# Patient Record
Sex: Male | Born: 1950 | Race: Black or African American | Hispanic: No | Smoking: Never smoker
Health system: Southern US, Community
[De-identification: ages and names within clinical notes are randomized; demographics above are authoritative.]

## PROBLEM LIST (undated history)

## (undated) DIAGNOSIS — K219 Gastro-esophageal reflux disease without esophagitis: Secondary | ICD-10-CM

## (undated) DIAGNOSIS — F419 Anxiety disorder, unspecified: Secondary | ICD-10-CM

## (undated) DIAGNOSIS — N3281 Overactive bladder: Secondary | ICD-10-CM

## (undated) DIAGNOSIS — H269 Unspecified cataract: Secondary | ICD-10-CM

## (undated) DIAGNOSIS — B001 Herpesviral vesicular dermatitis: Secondary | ICD-10-CM

## (undated) HISTORY — DX: Gastro-esophageal reflux disease without esophagitis: K21.9

## (undated) HISTORY — DX: Herpesviral vesicular dermatitis: B00.1

## (undated) HISTORY — DX: Unspecified cataract: H26.9

## (undated) HISTORY — DX: Anxiety disorder, unspecified: F41.9

## (undated) HISTORY — PX: REFRACTIVE SURGERY: SHX103

## (undated) HISTORY — DX: Overactive bladder: N32.81

---

## 2011-10-25 ENCOUNTER — Ambulatory Visit (INDEPENDENT_AMBULATORY_CARE_PROVIDER_SITE_OTHER): Payer: Federal, State, Local not specified - PPO

## 2011-10-25 DIAGNOSIS — B9789 Other viral agents as the cause of diseases classified elsewhere: Secondary | ICD-10-CM

## 2012-07-15 ENCOUNTER — Ambulatory Visit (INDEPENDENT_AMBULATORY_CARE_PROVIDER_SITE_OTHER): Payer: Federal, State, Local not specified - PPO | Admitting: Family Medicine

## 2012-07-15 VITALS — BP 119/78 | HR 68 | Temp 97.8°F | Resp 16 | Ht 70.25 in | Wt 211.6 lb

## 2012-07-15 DIAGNOSIS — H00019 Hordeolum externum unspecified eye, unspecified eyelid: Secondary | ICD-10-CM

## 2012-07-15 MED ORDER — DOXYCYCLINE HYCLATE 100 MG PO TABS: 100.0000 mg | ORAL_TABLET | Freq: Two times a day (BID) | ORAL | Status: AC

## 2012-07-15 MED ORDER — TOBRAMYCIN 0.3 % OP SOLN: 1.0000 [drp] | OPHTHALMIC | Status: AC

## 2012-07-15 NOTE — Progress Notes (Signed)
  Subjective:    Patient ID: Nicholas Huang, male    DOB: [birth year], 61 y.o.   MRN: [id number]  HPI Painful swelling left lower eye lid x several days.  He works for [employer] in [employer] center where it is dusty.  Symptoms began 4 days ago and he's tried an OTC stye ointment though symptoms are worsening   Review of Systems     Objective:   Physical Exam Well developed well nourished male in obvious pain due to swelling of left lower eye lid. EOM nl, PERLA Palpable 3 mm nodule medial lower left lid       Assessment & Plan:  Stye left eye treat with Doxycycline and Tobrex drops, instructed to use warm compresses

## 2012-08-01 ENCOUNTER — Other Ambulatory Visit: Payer: Self-pay | Admitting: Family Medicine

## 2012-08-29 ENCOUNTER — Ambulatory Visit (INDEPENDENT_AMBULATORY_CARE_PROVIDER_SITE_OTHER): Payer: Federal, State, Local not specified - PPO | Admitting: Family Medicine

## 2012-08-29 VITALS — BP 127/74 | HR 57 | Temp 98.2°F | Resp 16 | Ht 72.0 in | Wt 220.0 lb

## 2012-08-29 DIAGNOSIS — H00015 Hordeolum externum left lower eyelid: Secondary | ICD-10-CM

## 2012-08-29 DIAGNOSIS — H00019 Hordeolum externum unspecified eye, unspecified eyelid: Secondary | ICD-10-CM

## 2012-08-29 MED ORDER — DOXYCYCLINE HYCLATE 100 MG PO TABS: 100.0000 mg | ORAL_TABLET | Freq: Two times a day (BID) | ORAL | Status: DC

## 2012-08-29 NOTE — Patient Instructions (Addendum)
Sty A sty (hordeolum) is an infection of a gland in the eyelid located at the base of the eyelash. A sty may develop a white or yellow head of pus. It can be puffy (swollen). Usually, the sty will burst and pus will come out on its own. They do not leave lumps in the eyelid once they drain. A sty is often confused with another form of cyst of the eyelid called a chalazion. Chalazions occur within the eyelid and not on the edge where the bases of the eyelashes are. They often are red, sore and then form firm lumps in the eyelid. CAUSES   Germs (bacteria).  Lasting (chronic) eyelid inflammation. SYMPTOMS   Tenderness, redness and swelling along the edge of the eyelid at the base of the eyelashes.  Sometimes, there is a white or yellow head of pus. It may or may not drain. DIAGNOSIS  An ophthalmologist will be able to distinguish between a sty and a chalazion and treat the condition appropriately.  TREATMENT   Styes are typically treated with warm packs (compresses) until drainage occurs.  In rare cases, medicines that kill germs (antibiotics) may be prescribed. These antibiotics may be in the form of drops, cream or pills.  If a hard lump has formed, it is generally necessary to do a small incision and remove the hardened contents of the cyst in a minor surgical procedure done in the office.  In suspicious cases, your caregiver may send the contents of the cyst to the lab to be certain that it is not a rare, but dangerous form of cancer of the glands of the eyelid. HOME CARE INSTRUCTIONS   Wash your hands often and dry them with a clean towel. Avoid touching your eyelid. This may spread the infection to other parts of the eye.  Apply heat to your eyelid for 10 to 20 minutes, several times a day, to ease pain and help to heal it faster.  Do not squeeze the sty. Allow it to drain on its own. Wash your eyelid carefully 3 to 4 times per day to remove any pus. SEEK IMMEDIATE MEDICAL CARE IF:     Your eye becomes painful or puffy (swollen).  Your vision changes.  Your sty does not drain by itself within 3 days.  Your sty comes back within a short period of time, even with treatment.  You have redness (inflammation) around the eye.  You have a fever. Document Released: 08/11/2005 Document Revised: 01/24/2012 Document Reviewed: 04/15/2009 [facility] Patient Information 2013 [place], [place].    61 yo man with left lower lid stye which was evaluated 8/31 and patient subsequently underwent I&D.  The stye seemed to improve after antibiotics and the procedure, but has come back.  The area is now hardened and nonpainful  Objective:  Hordeolum 3-4 mm, several loculations, medial lower left lid partially overlying the tear duct  Assessment:  Persistent hordeolum  Plan:  ophth referral and doxycycline 1. Hordeolum externum of left lower eyelid  Ambulatory referral to Ophthalmology, doxycycline (VIBRA-TABS) 100 MG tablet

## 2012-08-29 NOTE — Progress Notes (Signed)
61 yo man with left lower lid stye which was evaluated 8/31 and patient subsequently underwent I&D.  The stye seemed to improve after antibiotics and the procedure, but has come back.  The area is now hardened and nonpainful  Objective:  Hordeolum 3-4 mm, several loculations, medial lower left lid partially overlying the tear duct  Assessment:  Persistent hordeolum  Plan:  ophth referral and doxycycline 1. Hordeolum externum of left lower eyelid  Ambulatory referral to Ophthalmology, doxycycline (VIBRA-TABS) 100 MG tablet

## 2012-12-21 ENCOUNTER — Encounter: Payer: Self-pay | Admitting: *Deleted

## 2013-03-07 ENCOUNTER — Ambulatory Visit (INDEPENDENT_AMBULATORY_CARE_PROVIDER_SITE_OTHER): Payer: Federal, State, Local not specified - PPO | Admitting: Family Medicine

## 2013-03-07 VITALS — BP 142/82 | HR 62 | Temp 98.3°F | Resp 17 | Ht 71.0 in | Wt 220.0 lb

## 2013-03-07 DIAGNOSIS — B001 Herpesviral vesicular dermatitis: Secondary | ICD-10-CM

## 2013-03-07 DIAGNOSIS — L253 Unspecified contact dermatitis due to other chemical products: Secondary | ICD-10-CM

## 2013-03-07 DIAGNOSIS — B009 Herpesviral infection, unspecified: Secondary | ICD-10-CM

## 2013-03-07 MED ORDER — TRIAMCINOLONE ACETONIDE 0.1 % EX CREA: TOPICAL_CREAM | Freq: Two times a day (BID) | CUTANEOUS | Status: DC

## 2013-03-07 MED ORDER — VALACYCLOVIR HCL 1 G PO TABS: 1000.0000 mg | ORAL_TABLET | Freq: Three times a day (TID) | ORAL | Status: DC

## 2013-03-07 NOTE — Progress Notes (Signed)
[address]   [place], [place]  [address]   [phone]  Subjective:    Patient ID: Nicholas Huang, male    DOB: 1951-10-14, 62 y.o.   MRN: [id number]  HPI This 62 y.o. male presents for evaluation of blisters on L lower lip, L chin.  Onset two days ago.  Feels well; no fever/chills/sweats. +tingling; +itching.  History of cold sores.  Central lip location which is unusual.  No myalgias.  History of herpes labialis but usually does not spread to face.  No pain along rash.  Missed work today for office visit; works at [employer] and does not want to work with facial rash.  S/p shingles vaccine.  2.  Itching hands B and feet B:  Works at [employer]; washes hands frequently; hands itch; no rash or scaling.  Applying good moisturizer twice daily with persistent itching.   Review of Systems  Constitutional: Negative for fever, chills, diaphoresis and fatigue.  Musculoskeletal: Negative for myalgias.  Skin: Positive for color change and rash.       +itching.  Neurological: Negative for dizziness, light-headedness and headaches.  Hematological: Negative for adenopathy.   Past Medical History  Diagnosis Date  . Cataract   . GERD (gastroesophageal reflux disease)   . Anxiety   . Herpes labialis    No current outpatient prescriptions on file prior to visit.   No current facility-administered medications on file prior to visit.        History   Social History  . Marital Status: Divorced    Spouse Name: N/A    Number of Children: N/A  . Years of Education: N/A   Occupational History  .  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   Social History Main Topics  . Smoking status: Never Smoker   . Smokeless tobacco: Not on file  . Alcohol Use: No  . Drug Use: No  . Sexually Active: Yes   Other Topics Concern  . Not on file   Social History Narrative  . No narrative on file    Objective:   Physical Exam  Nursing note and vitals reviewed. Constitutional: He appears  well-developed and well-nourished. No distress.  HENT:  Head: Normocephalic and atraumatic.  Eyes: Conjunctivae are normal. Pupils are equal, round, and reactive to light.  Neck: Normal range of motion. Neck supple.  Lymphadenopathy:    He has no cervical adenopathy.  Skin: Rash noted. He is not diaphoretic.  Lower lip with single cluster of vesicles L.  Two vesicular lesions L lower chin.  No other lesions facial. B HANDS: DRY; NO SCALING; MILD MACULAR LESIONS SCATTERED B PALMS.       Assessment & Plan:  Herpes simplex labialis  Dermatitis, contact, from chemicals - Plan: triamcinolone cream (KENALOG) 0.1 %   1. Herpes Labialis:  New. Versus early HSV Zoster.  Reviewed in detail with patient.  Rx for Valtrex 1000mg  one po tid x 7 days. To call if develops more lesions or pain; will add Prednisone taper. OOW x 2 days. 2.  Contact Dermatitis B hands:  New.  Versus dishydrotic eczema.  Rx for Triamcinolone cream 0.1% bid.  Decrease hand washing as much as possible.  Meds ordered this encounter  Medications  . valACYclovir (VALTREX) 1000 MG tablet    Sig: Take 1 tablet (1,000 mg total) by mouth 3 (three) times daily.    Dispense:  20 tablet    Refill:  0  . triamcinolone cream (  KENALOG) 0.1 %    Sig: Apply topically 2 (two) times daily.    Dispense:  45 g    Refill:  0

## 2013-03-07 NOTE — Patient Instructions (Addendum)
1.  CALL IF RASH SPREADS.

## 2013-04-23 ENCOUNTER — Other Ambulatory Visit: Payer: Self-pay | Admitting: Family Medicine

## 2014-02-26 ENCOUNTER — Ambulatory Visit (INDEPENDENT_AMBULATORY_CARE_PROVIDER_SITE_OTHER): Payer: Federal, State, Local not specified - PPO | Admitting: Emergency Medicine

## 2014-02-26 ENCOUNTER — Encounter: Payer: Self-pay | Admitting: Emergency Medicine

## 2014-02-26 VITALS — BP 134/85 | HR 51 | Temp 97.6°F | Resp 18 | Wt 219.0 lb

## 2014-02-26 DIAGNOSIS — L259 Unspecified contact dermatitis, unspecified cause: Secondary | ICD-10-CM

## 2014-02-26 MED ORDER — TRIAMCINOLONE ACETONIDE 0.1 % EX CREA: 1.0000 "application " | TOPICAL_CREAM | Freq: Two times a day (BID) | CUTANEOUS | Status: DC

## 2014-02-26 NOTE — Progress Notes (Signed)
[facility] [address], [place] [id number] [phone]  Date:  02/26/2014   Name:  Nicholas Huang   DOB:  Aug 21, 1951   MRN:  [id number]  PCP:  No primary provider on file.    Chief Complaint: Rash   History of Present Illness:  Nicholas PedFreeman Huang is a 63 y.o. very pleasant male patient who presents with the following:  Noticed a pruritic lesion on the back of his neck.  Intensely itchy.  No drainage.  No fever or chills.  No allergic contact.  No improvement with over the counter medications or other home remedies. Denies other complaint or health concern today.   Patient Active Problem List   Diagnosis Date Noted  . Chalazion 12/21/2012    Past Medical History  Diagnosis Date  . Cataract   . GERD (gastroesophageal reflux disease)   . Anxiety   . Herpes labialis     Past Surgical History  Procedure Laterality Date  . Refractive surgery      History  Substance Use Topics  . Smoking status: Never Smoker   . Smokeless tobacco: Not on file  . Alcohol Use: No    Family History  Problem Relation Age of Onset  . Hypertension Mother   . Diabetes Mother     No Known Allergies  Medication list has been reviewed and updated.  No current outpatient prescriptions on file prior to visit.   No current facility-administered medications on file prior to visit.    Review of Systems:  As per HPI, otherwise negative.    Physical Examination: Filed Vitals:   02/26/14 0853  BP: 134/85  Pulse: 51  Temp: 97.6 F (36.4 C)  Resp: 18   Filed Vitals:   02/26/14 0853  Weight: 219 lb (99.338 kg)   Body mass index is 30.56 kg/(m^2). Ideal Body Weight:     GEN: WDWN, NAD, Non-toxic, Alert & Oriented x 3 HEENT: Atraumatic, Normocephalic.  Ears and Nose: No external deformity. EXTR: No clubbing/cyanosis/edema NEURO: Normal gait.  PSYCH: Normally interactive. Conversant. Not depressed or anxious appearing.  Calm demeanor.  SKIN:  Patch of  edematous, erythematous area on neck.  Excoriated.  Raised.  Assessment and Plan: Allergic dermatitis TAC  Signed,  Phillips OdorJeffery Astha Probasco, MD

## 2015-10-10 ENCOUNTER — Ambulatory Visit (INDEPENDENT_AMBULATORY_CARE_PROVIDER_SITE_OTHER): Payer: Federal, State, Local not specified - PPO | Admitting: Family Medicine

## 2015-10-10 VITALS — BP 120/78 | HR 89 | Temp 98.7°F | Resp 18 | Ht 71.0 in | Wt 221.8 lb

## 2015-10-10 DIAGNOSIS — F32A Depression, unspecified: Secondary | ICD-10-CM

## 2015-10-10 DIAGNOSIS — G4700 Insomnia, unspecified: Secondary | ICD-10-CM

## 2015-10-10 DIAGNOSIS — F329 Major depressive disorder, single episode, unspecified: Secondary | ICD-10-CM

## 2015-10-10 MED ORDER — ZOLPIDEM TARTRATE 5 MG PO TABS: 5.0000 mg | ORAL_TABLET | Freq: Every evening | ORAL | Status: DC | PRN

## 2015-10-10 MED ORDER — TRAZODONE HCL 50 MG PO TABS: 25.0000 mg | ORAL_TABLET | Freq: Every evening | ORAL | Status: DC | PRN

## 2015-10-10 NOTE — Patient Instructions (Addendum)
Take the Ambien 5 mg 15-30 minutes before going to bed for sleep. After a couple of nights you can try cutting back to just one half of a pill to see if that is sufficient.  Take trazodone 50 mg 1/2-1 pill at bedtime. This will help you keep asleep longer.  Recommend considering counseling: Karmen Bongo [phone] Nicole Cella  Same as above [facility] [phone]   Return if not improving considerably over the next week or 2 after a few good nights sleep. We may need to get she started on a antidepressant medication.  Return or go to the emergency room or seek help elsewhere immediately if getting any suicidal thinking.  Insomnia Insomnia is a sleep disorder that makes it difficult to fall asleep or to stay asleep. Insomnia can cause tiredness (fatigue), low energy, difficulty concentrating, mood swings, and poor performance at work or school.  There are three different ways to classify insomnia:  Difficulty falling asleep.  Difficulty staying asleep.  Waking up too early in the morning. Any type of insomnia can be long-term (chronic) or short-term (acute). Both are common. Short-term insomnia usually lasts for three months or less. Chronic insomnia occurs at least three times a week for longer than three months. CAUSES  Insomnia may be caused by another condition, situation, or substance, such as:  Anxiety.  Certain medicines.  Gastroesophageal reflux disease (GERD) or other gastrointestinal conditions.  Asthma or other breathing conditions.  Restless legs syndrome, sleep apnea, or other sleep disorders.  Chronic pain.  Menopause. This may include hot flashes.  Stroke.  Abuse of alcohol, tobacco, or illegal drugs.  Depression.  Caffeine.   Neurological disorders, such as Alzheimer disease.  An overactive thyroid (hyperthyroidism). The cause of insomnia may not be known. RISK FACTORS Risk factors for insomnia include:  Gender. Women  are more commonly affected than men.  Age. Insomnia is more common as you get older.  Stress. This may involve your professional or personal life.  Income. Insomnia is more common in people with lower income.  Lack of exercise.   Irregular work schedule or night shifts.  Traveling between different time zones. SIGNS AND SYMPTOMS If you have insomnia, trouble falling asleep or trouble staying asleep is the main symptom. This may lead to other symptoms, such as:  Feeling fatigued.  Feeling nervous about going to sleep.  Not feeling rested in the morning.  Having trouble concentrating.  Feeling irritable, anxious, or depressed. TREATMENT  Treatment for insomnia depends on the cause. If your insomnia is caused by an underlying condition, treatment will focus on addressing the condition. Treatment may also include:   Medicines to help you sleep.  Counseling or therapy.  Lifestyle adjustments. HOME CARE INSTRUCTIONS   Take medicines only as directed by your health care provider.  Keep regular sleeping and waking hours. Avoid naps.  Keep a sleep diary to help you and your health care provider figure out what could be causing your insomnia. Include:   When you sleep.  When you wake up during the night.  How well you sleep.   How rested you feel the next day.  Any side effects of medicines you are taking.  What you eat and drink.   Make your bedroom a comfortable place where it is easy to fall asleep:  Put up shades or special blackout curtains to block light from outside.  Use a white noise machine to block noise.  Keep the temperature cool.   Exercise  regularly as directed by your health care provider. Avoid exercising right before bedtime.  Use relaxation techniques to manage stress. Ask your health care provider to suggest some techniques that may work well for you. These may include:  Breathing exercises.  Routines to release muscle  tension.  Visualizing peaceful scenes.  Cut back on alcohol, caffeinated beverages, and cigarettes, especially close to bedtime. These can disrupt your sleep.  Do not overeat or eat spicy foods right before bedtime. This can lead to digestive discomfort that can make it hard for you to sleep.  Limit screen use before bedtime. This includes:  Watching TV.  Using your smartphone, tablet, and computer.  Stick to a routine. This can help you fall asleep faster. Try to do a quiet activity, brush your teeth, and go to bed at the same time each night.  Get out of bed if you are still awake after 15 minutes of trying to sleep. Keep the lights down, but try reading or doing a quiet activity. When you feel sleepy, go back to bed.  Make sure that you drive carefully. Avoid driving if you feel very sleepy.  Keep all follow-up appointments as directed by your health care provider. This is important. SEEK MEDICAL CARE IF:   You are tired throughout the day or have trouble in your daily routine due to sleepiness.  You continue to have sleep problems or your sleep problems get worse. SEEK IMMEDIATE MEDICAL CARE IF:   You have serious thoughts about hurting yourself or someone else.   This information is not intended to replace advice given to you by your health care provider. Make sure you discuss any questions you have with your health care provider.   Document Released: 10/29/2000 Document Revised: 07/23/2015 Document Reviewed: 08/02/2014 Elsevier Interactive Patient Education [employer] Elsevier Inc.

## 2015-10-10 NOTE — Progress Notes (Signed)
Patient ID: Nicholas Huang, male    DOB: 02-Nov-1951  Age: 64 y.o. MRN: [id number]  Chief Complaint  Patient presents with  . Insomnia    started last month, only can sleep 2 hrs all day   . Depression    see screening    Subjective:   64 year old man who has been struggling with not sleeping over the past month. A lot of it has to do with a broken relationship with a male friend of the last 2 years. It is on and off right now. He hasn't slept about 2 hours a night. He finally gets up in watches television. He plays golf in the daytime. He is retired. He sits at home and watches TV at night. He eats alone. He did spend yesterday, Thanksgiving, with family. Denies suicidal ideation.  Current allergies, medications, problem list, past/family and social histories reviewed.  Objective:  BP 120/78 mmHg  Pulse 89  Temp(Src) 98.7 F (37.1 C) (Oral)  Resp 18  Ht  (1.803 m)  Wt 221 lb 12.8 oz (100.608 kg)  BMI 30.95 kg/m2  SpO2 98%  No major acute distress. He is very sad and tired looking. Did not examine him. Spent 15-20 minutes in counseling.  Assessment & Plan:   Assessment: 1. Insomnia   2. Depression       Plan:  Urged him to consider counseling. He is gotten counseling in the past before he moved here. He was from [facility]. [place] he was working on [facility] previously. He is a former Paramedic.  No orders of the defined types were placed in this encounter.    Meds ordered this encounter  Medications  . zolpidem (AMBIEN) 5 MG tablet    Sig: Take 1 tablet (5 mg total) by mouth at bedtime as needed for sleep.    Dispense:  15 tablet    Refill:  1  . traZODone (DESYREL) 50 MG tablet    Sig: Take 0.5-1 tablets (25-50 mg total) by mouth at bedtime as needed for sleep.    Dispense:  30 tablet    Refill:  1         Patient Instructions  Take the Ambien 5 mg 15-30 minutes before going to bed for sleep. After a couple of nights you can try cutting back to  just one half of a pill to see if that is sufficient.  Take trazodone 50 mg 1/2-1 pill at bedtime. This will help you keep asleep longer.  Recommend considering counseling: Karmen Bongo [phone] Nicole Cella  Same as above [facility] [phone]   Return if not improving considerably over the next week or 2 after a few good nights sleep. We may need to get she started on a antidepressant medication.  Return or go to the emergency room or seek help elsewhere immediately if getting any suicidal thinking.  Insomnia Insomnia is a sleep disorder that makes it difficult to fall asleep or to stay asleep. Insomnia can cause tiredness (fatigue), low energy, difficulty concentrating, mood swings, and poor performance at work or school.  There are three different ways to classify insomnia:  Difficulty falling asleep.  Difficulty staying asleep.  Waking up too early in the morning. Any type of insomnia can be long-term (chronic) or short-term (acute). Both are common. Short-term insomnia usually lasts for three months or less. Chronic insomnia occurs at least three times a week for longer than three months. CAUSES  Insomnia may be caused by another condition,  situation, or substance, such as:  Anxiety.  Certain medicines.  Gastroesophageal reflux disease (GERD) or other gastrointestinal conditions.  Asthma or other breathing conditions.  Restless legs syndrome, sleep apnea, or other sleep disorders.  Chronic pain.  Menopause. This may include hot flashes.  Stroke.  Abuse of alcohol, tobacco, or illegal drugs.  Depression.  Caffeine.   Neurological disorders, such as Alzheimer disease.  An overactive thyroid (hyperthyroidism). The cause of insomnia may not be known. RISK FACTORS Risk factors for insomnia include:  Gender. Women are more commonly affected than men.  Age. Insomnia is more common as you get older.  Stress. This may involve your  professional or personal life.  Income. Insomnia is more common in people with lower income.  Lack of exercise.   Irregular work schedule or night shifts.  Traveling between different time zones. SIGNS AND SYMPTOMS If you have insomnia, trouble falling asleep or trouble staying asleep is the main symptom. This may lead to other symptoms, such as:  Feeling fatigued.  Feeling nervous about going to sleep.  Not feeling rested in the morning.  Having trouble concentrating.  Feeling irritable, anxious, or depressed. TREATMENT  Treatment for insomnia depends on the cause. If your insomnia is caused by an underlying condition, treatment will focus on addressing the condition. Treatment may also include:   Medicines to help you sleep.  Counseling or therapy.  Lifestyle adjustments. HOME CARE INSTRUCTIONS   Take medicines only as directed by your health care provider.  Keep regular sleeping and waking hours. Avoid naps.  Keep a sleep diary to help you and your health care provider figure out what could be causing your insomnia. Include:   When you sleep.  When you wake up during the night.  How well you sleep.   How rested you feel the next day.  Any side effects of medicines you are taking.  What you eat and drink.   Make your bedroom a comfortable place where it is easy to fall asleep:  Put up shades or special blackout curtains to block light from outside.  Use a white noise machine to block noise.  Keep the temperature cool.   Exercise regularly as directed by your health care provider. Avoid exercising right before bedtime.  Use relaxation techniques to manage stress. Ask your health care provider to suggest some techniques that may work well for you. These may include:  Breathing exercises.  Routines to release muscle tension.  Visualizing peaceful scenes.  Cut back on alcohol, caffeinated beverages, and cigarettes, especially close to bedtime.  These can disrupt your sleep.  Do not overeat or eat spicy foods right before bedtime. This can lead to digestive discomfort that can make it hard for you to sleep.  Limit screen use before bedtime. This includes:  Watching TV.  Using your smartphone, tablet, and computer.  Stick to a routine. This can help you fall asleep faster. Try to do a quiet activity, brush your teeth, and go to bed at the same time each night.  Get out of bed if you are still awake after 15 minutes of trying to sleep. Keep the lights down, but try reading or doing a quiet activity. When you feel sleepy, go back to bed.  Make sure that you drive carefully. Avoid driving if you feel very sleepy.  Keep all follow-up appointments as directed by your health care provider. This is important. SEEK MEDICAL CARE IF:   You are tired throughout the day or have  trouble in your daily routine due to sleepiness.  You continue to have sleep problems or your sleep problems get worse. SEEK IMMEDIATE MEDICAL CARE IF:   You have serious thoughts about hurting yourself or someone else.   This information is not intended to replace advice given to you by your health care provider. Make sure you discuss any questions you have with your health care provider.   Document Released: 10/29/2000 Document Revised: 07/23/2015 Document Reviewed: 08/02/2014 Elsevier Interactive Patient Education [employer]      Return if symptoms worsen or fail to improve.   HOPPER,DAVID, MD 10/10/2015

## 2015-10-22 ENCOUNTER — Ambulatory Visit (INDEPENDENT_AMBULATORY_CARE_PROVIDER_SITE_OTHER): Payer: Federal, State, Local not specified - PPO | Admitting: Family Medicine

## 2015-10-22 VITALS — BP 126/84 | HR 56 | Temp 97.8°F | Resp 18 | Ht 71.0 in | Wt 217.6 lb

## 2015-10-22 DIAGNOSIS — F418 Other specified anxiety disorders: Secondary | ICD-10-CM

## 2015-10-22 DIAGNOSIS — F32A Depression, unspecified: Secondary | ICD-10-CM

## 2015-10-22 DIAGNOSIS — F329 Major depressive disorder, single episode, unspecified: Secondary | ICD-10-CM | POA: Insufficient documentation

## 2015-10-22 DIAGNOSIS — G4700 Insomnia, unspecified: Secondary | ICD-10-CM | POA: Diagnosis not present

## 2015-10-22 DIAGNOSIS — F4310 Post-traumatic stress disorder, unspecified: Secondary | ICD-10-CM | POA: Diagnosis not present

## 2015-10-22 DIAGNOSIS — F419 Anxiety disorder, unspecified: Principal | ICD-10-CM | POA: Insufficient documentation

## 2015-10-22 MED ORDER — SERTRALINE HCL 50 MG PO TABS: 50.0000 mg | ORAL_TABLET | Freq: Every day | ORAL | Status: DC

## 2015-10-22 NOTE — Patient Instructions (Signed)
Major Depressive Disorder Major depressive disorder is a mental illness. It also may be called clinical depression or unipolar depression. Major depressive disorder usually causes feelings of sadness, hopelessness, or helplessness. Some people with this disorder do not feel particularly sad but lose interest in doing things they used to enjoy (anhedonia). Major depressive disorder also can cause physical symptoms. It can interfere with work, school, relationships, and other normal everyday activities. The disorder varies in severity but is longer lasting and more serious than the sadness we all feel from time to time in our lives. Major depressive disorder often is triggered by stressful life events or major life changes. Examples of these triggers include divorce, loss of your job or home, a move, and the death of a family member or close friend. Sometimes this disorder occurs for no obvious reason at all. People who have family members with major depressive disorder or bipolar disorder are at higher risk for developing this disorder, with or without life stressors. Major depressive disorder can occur at any age. It may occur just once in your life (single episode major depressive disorder). It may occur multiple times (recurrent major depressive disorder). SYMPTOMS People with major depressive disorder have either anhedonia or depressed mood on nearly a daily basis for at least 2 weeks or longer. Symptoms of depressed mood include:  Feelings of sadness (blue or down in the dumps) or emptiness.  Feelings of hopelessness or helplessness.  Tearfulness or episodes of crying (may be observed by others).  Irritability (children and adolescents). In addition to depressed mood or anhedonia or both, people with this disorder have at least four of the following symptoms:  Difficulty sleeping or sleeping too much.   Significant change (increase or decrease) in appetite or weight.   Lack of energy or  motivation.  Feelings of guilt and worthlessness.   Difficulty concentrating, remembering, or making decisions.  Unusually slow movement (psychomotor retardation) or restlessness (as observed by others).   Recurrent wishes for death, recurrent thoughts of self-harm (suicide), or a suicide attempt. People with major depressive disorder commonly have persistent negative thoughts about themselves, other people, and the world. People with severe major depressive disorder may experiencedistorted beliefs or perceptions about the world (psychotic delusions). They also may see or hear things that are not real (psychotic hallucinations). DIAGNOSIS Major depressive disorder is diagnosed through an assessment by your health care provider. Your health care provider will ask aboutaspects of your daily life, such as mood,sleep, and appetite, to see if you have the diagnostic symptoms of major depressive disorder. Your health care provider may ask about your medical history and use of alcohol or drugs, including prescription medicines. Your health care provider also may do a physical exam and blood work. This is because certain medical conditions and the use of certain substances can cause major depressive disorder-like symptoms (secondary depression). Your health care provider also may refer you to a mental health specialist for further evaluation and treatment. TREATMENT It is important to recognize the symptoms of major depressive disorder and seek treatment. The following treatments can be prescribed for this disorder:   Medicine. Antidepressant medicines usually are prescribed. Antidepressant medicines are thought to correct chemical imbalances in the brain that are commonly associated with major depressive disorder. Other types of medicine may be added if the symptoms do not respond to antidepressant medicines alone or if psychotic delusions or hallucinations occur.  Talk therapy. Talk therapy can be  helpful in treating major depressive disorder by providing   support, education, and guidance. Certain types of talk therapy also can help with negative thinking (cognitive behavioral therapy) and with relationship issues that trigger this disorder (interpersonal therapy). A mental health specialist can help determine which treatment is best for you. Most people with major depressive disorder do well with a combination of medicine and talk therapy. Treatments involving electrical stimulation of the brain can be used in situations with extremely severe symptoms or when medicine and talk therapy do not work over time. These treatments include electroconvulsive therapy, transcranial magnetic stimulation, and vagal nerve stimulation.   This information is not intended to replace advice given to you by your health care provider. Make sure you discuss any questions you have with your health care provider.   Document Released: 02/26/2013 Document Revised: 11/22/2014 Document Reviewed: 02/26/2013 Elsevier Interactive Patient Education 2016 Elsevier Inc.  

## 2015-10-22 NOTE — Progress Notes (Signed)
Subjective:    Patient ID: Dimitri PedFreeman Wilcoxson, male    DOB: [birth year], 64 y.o.   MRN: [id number]  10/22/2015  Depression   HPI This 64 y.o. male presents for evaluation of depression.  Letter sent by psychologist Nicole Cellalaude Ragan, PhD who recommends treatment with sertraline. Trigger of depression was recent break up with girlfriend of two years.  Did not expect break up.  Children in [place] but not local in [place].   Suffers with chronic insomnia that is exacerbated by depression.  Prescribed Trazodone and Ambien three weeks ago by Dr. Alwyn RenHopper; does not like medication so has not taken consistently.  War veteran with PTSD chronic; suffers with chronic anxiety with excessive worry. Retired one year ago; retired from [employer].  21 years.  Does not like to take medications.   Exercising two to three times per week.  Exercise helps with mood yet not prolonged.   Talking with ex-wife again which is hopeful.   No SI/HI.  Easily tearful; decreased appetite.  Weight down five pounds. No family history of depression or anxiety or mental illness in parents; son does take medication for depression; not aware of treatment.  Has undergone psychotherapy intermittently throughout the years; just established with new therapist today.  PCP: Dr. Alwyn RenHopper but [facility]   Review of Systems  Constitutional: Negative for fever, chills, diaphoresis, activity change, appetite change and fatigue.  Respiratory: Negative for cough and shortness of breath.   Cardiovascular: Negative for chest pain, palpitations and leg swelling.  Gastrointestinal: Negative for nausea, vomiting, abdominal pain and diarrhea.  Endocrine: Negative for cold intolerance, heat intolerance, polydipsia, polyphagia and polyuria.  Skin: Negative for color change, rash and wound.  Neurological: Negative for dizziness, tremors, seizures, syncope, facial asymmetry, speech difficulty, weakness, light-headedness, numbness and headaches.    Psychiatric/Behavioral: Positive for sleep disturbance and dysphoric mood. Negative for suicidal ideas and self-injury.    Past Medical History  Diagnosis Date  . Cataract   . GERD (gastroesophageal reflux disease)   . Anxiety   . Herpes labialis    Past Surgical History  Procedure Laterality Date  . Refractive surgery     No Known Allergies Current Outpatient Prescriptions  Medication Sig Dispense Refill  . zolpidem (AMBIEN) 5 MG tablet Take 1 tablet (5 mg total) by mouth at bedtime as needed for sleep. 15 tablet 1  . sertraline (ZOLOFT) 50 MG tablet Take 1 tablet (50 mg total) by mouth daily. 30 tablet 3  . traZODone (DESYREL) 50 MG tablet Take 0.5-1 tablets (25-50 mg total) by mouth at bedtime as needed for sleep. (Patient not taking: Reported on 10/22/2015) 30 tablet 1  . triamcinolone cream (KENALOG) 0.1 % Apply 1 application topically 2 (two) times daily. (Patient not taking: Reported on 10/10/2015) 30 g 1   No current facility-administered medications for this visit.   Social History   Social History  . Marital Status: Divorced    Spouse Name: N/A  . Number of Children: N/A  . Years of Education: N/A   Occupational History  . 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   Social History Main Topics  . Smoking status: Never Smoker   . Smokeless tobacco: Not on file  . Alcohol Use: No  . Drug Use: No  . Sexual Activity: Yes   Other Topics Concern  . Not on file   Social History Narrative   Marital status; divorced     Children: 2 children; 7 grandchildren  Lives: alone      Tobacco: none      Alcohol: none      Drugs; none      Exercise: gym; weights; stepper; twice weekly.     Family History  Problem Relation Age of Onset  . Hypertension Mother   . Diabetes Mother        Objective:    BP 126/84 mmHg  Pulse 56  Temp(Src) 97.8 F (36.6 C) (Oral)  Resp 18  Ht  (1.803 m)  Wt 217 lb 9.6 oz (98.703 kg)  BMI 30.36 kg/m2  SpO2  98% Physical Exam  Constitutional: He is oriented to person, place, and time. He appears well-developed and well-nourished. No distress.  HENT:  Head: Normocephalic and atraumatic.  Right Ear: External ear normal.  Left Ear: External ear normal.  Nose: Nose normal.  Mouth/Throat: Oropharynx is clear and moist.  Eyes: Conjunctivae and EOM are normal. Pupils are equal, round, and reactive to light.  Neck: Normal range of motion. Neck supple. Carotid bruit is not present. No thyromegaly present.  Cardiovascular: Normal rate, regular rhythm, normal heart sounds and intact distal pulses.  Exam reveals no gallop and no friction rub.   No murmur heard. Pulmonary/Chest: Effort normal and breath sounds normal. He has no wheezes. He has no rales.  Abdominal: Soft. Bowel sounds are normal. He exhibits no distension and no mass. There is no tenderness. There is no rebound and no guarding.  Lymphadenopathy:    He has no cervical adenopathy.  Neurological: He is alert and oriented to person, place, and time. No cranial nerve deficit.  Skin: Skin is warm and dry. No rash noted. He is not diaphoretic.  Psychiatric: He has a normal mood and affect. His behavior is normal.  Nursing note and vitals reviewed.  No results found for this or any previous visit.     Assessment & Plan:   1. Anxiety and depression   2. PTSD (post-traumatic stress disorder)   3. Insomnia    -New depression and chronic anxiety. -rx for Sertraline  1/2 tablet daily for one week and then increase to one daily.  RTC 4 weeks with PCP at [facility]. -Continue Trazodone and Ambien for insomnia -Continue weekly psychotherapy. -Increase exercise to 5 days per week.   No orders of the defined types were placed in this encounter.   Meds ordered this encounter  Medications  . sertraline (ZOLOFT) 50 MG tablet    Sig: Take 1 tablet (50 mg total) by mouth daily.    Dispense:  30 tablet    Refill:  3    Return in about 4 weeks  (around 11/19/2015) for recheck depression.    Yevonne Yokum Paulita Fujita, M.D. [facility] [address] [place], [place]  [address] [phone] phone [phone] fax

## 2015-11-26 ENCOUNTER — Ambulatory Visit (INDEPENDENT_AMBULATORY_CARE_PROVIDER_SITE_OTHER): Payer: Federal, State, Local not specified - PPO | Admitting: Family Medicine

## 2015-11-26 VITALS — BP 110/70 | HR 75 | Temp 98.4°F | Resp 20 | Wt 220.4 lb

## 2015-11-26 DIAGNOSIS — G4700 Insomnia, unspecified: Secondary | ICD-10-CM | POA: Diagnosis not present

## 2015-11-26 DIAGNOSIS — F418 Other specified anxiety disorders: Secondary | ICD-10-CM | POA: Diagnosis not present

## 2015-11-26 DIAGNOSIS — F32A Depression, unspecified: Secondary | ICD-10-CM

## 2015-11-26 DIAGNOSIS — F419 Anxiety disorder, unspecified: Principal | ICD-10-CM

## 2015-11-26 DIAGNOSIS — F329 Major depressive disorder, single episode, unspecified: Secondary | ICD-10-CM

## 2015-11-26 DIAGNOSIS — F4310 Post-traumatic stress disorder, unspecified: Secondary | ICD-10-CM

## 2015-11-26 MED ORDER — SERTRALINE HCL 100 MG PO TABS: 100.0000 mg | ORAL_TABLET | Freq: Every day | ORAL | Status: DC

## 2015-11-26 NOTE — Progress Notes (Signed)
Subjective:    Patient ID: Nicholas Huang, male    DOB: December 10, 1950, 65 y.o.   MRN: [id number]  11/26/2015  Follow-up   HPI This 65 y.o. male presents for evaluation of anxiety, insomia, PTSD.  Started Zoloft 50mg  1/2 daily for two weeks and then increased to one tablet daily.  Sleeping really well; makes drowsy.  Mild dizziness.  No Trazodone or Ambien.  1/2 day of sadness per week.  Anxiety is still present;   Sadness is 50% improved.  Anxiety is some better; excessive worry is 50%. Exercising  3 times per week.  Not back together with ex-girlfriend; trying to be friends.  Ex wife is becoming closer.  Therapist once weekly.     Review of Systems  Constitutional: Negative for fever, chills, diaphoresis, activity change, appetite change and fatigue.  Respiratory: Negative for cough and shortness of breath.   Cardiovascular: Negative for chest pain, palpitations and leg swelling.  Gastrointestinal: Negative for nausea, vomiting, abdominal pain and diarrhea.  Endocrine: Negative for cold intolerance, heat intolerance, polydipsia, polyphagia and polyuria.  Skin: Negative for color change, rash and wound.  Neurological: Negative for dizziness, tremors, seizures, syncope, facial asymmetry, speech difficulty, weakness, light-headedness, numbness and headaches.  Psychiatric/Behavioral: Positive for sleep disturbance and dysphoric mood. Negative for suicidal ideas and self-injury. The patient is nervous/anxious.     Past Medical History  Diagnosis Date  . Cataract   . GERD (gastroesophageal reflux disease)   . Anxiety   . Herpes labialis    Past Surgical History  Procedure Laterality Date  . Refractive surgery     No Known Allergies  Social History   Social History  . Marital Status: Divorced    Spouse Name: N/A  . Number of Children: N/A  . Years of Education: N/A   Occupational History  . 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   Social History Main Topics  .  Smoking status: Never Smoker   . Smokeless tobacco: Not on file  . Alcohol Use: No  . Drug Use: No  . Sexual Activity: Yes   Other Topics Concern  . Not on file   Social History Narrative   Marital status; divorced     Children: 2 children; 7 grandchildren      Lives: alone      Tobacco: none      Alcohol: none      Drugs; none      Exercise: gym; weights; stepper; twice weekly.     Family History  Problem Relation Age of Onset  . Hypertension Mother   . Diabetes Mother        Objective:    BP 110/70 mmHg  Pulse 75  Temp(Src) 98.4 F (36.9 C) (Oral)  Resp 20  Wt 220 lb 6.4 oz (99.973 kg)  SpO2 98% Physical Exam  Constitutional: He is oriented to person, place, and time. He appears well-developed and well-nourished. No distress.  HENT:  Head: Normocephalic and atraumatic.  Right Ear: External ear normal.  Left Ear: External ear normal.  Nose: Nose normal.  Mouth/Throat: Oropharynx is clear and moist.  Eyes: Conjunctivae and EOM are normal. Pupils are equal, round, and reactive to light.  Neck: Normal range of motion. Neck supple. Carotid bruit is not present. No thyromegaly present.  Cardiovascular: Normal rate, regular rhythm, normal heart sounds and intact distal pulses.  Exam reveals no gallop and no friction rub.   No murmur heard. Pulmonary/Chest: Effort normal and breath sounds  normal. He has no wheezes. He has no rales.  Abdominal: Soft. Bowel sounds are normal. He exhibits no distension and no mass. There is no tenderness. There is no rebound and no guarding.  Lymphadenopathy:    He has no cervical adenopathy.  Neurological: He is alert and oriented to person, place, and time. No cranial nerve deficit.  Skin: Skin is warm and dry. No rash noted. He is not diaphoretic.  Psychiatric: He has a normal mood and affect. His behavior is normal. Judgment and thought content normal.  Nursing note and vitals reviewed.  No results found for this or any previous  visit.  Depression screen [facility] 2/9 11/26/2015 10/22/2015 10/10/2015  Decreased Interest 0 1 2  Down, Depressed, Hopeless 0 2 2  PHQ - 2 Score 0 3 4  Altered sleeping 0 1 3  Tired, decreased energy 0 0 0  Change in appetite 0 2 2  Feeling bad or failure about yourself  0 0 2  Trouble concentrating 0 2 3  Moving slowly or fidgety/restless 0 2 2  Suicidal thoughts 0 0 0  PHQ-9 Score 0 10 16  Difficult doing work/chores Not difficult at all Not difficult at all Somewhat difficult    GAD 7 : Generalized Anxiety Score 11/26/2015  Nervous, Anxious, on Edge 2  Control/stop worrying 3  Worry too much - different things 3  Trouble relaxing 3  Restless 3  Easily annoyed or irritable 2  Afraid - awful might happen 1  Total GAD 7 Score 17         Assessment & Plan:   1. Anxiety and depression   2. Insomnia   3. PTSD (post-traumatic stress disorder)    -Improved. -Increase Zoloft to 100mg  daily. -continue weekly psychotherapy.  -depression improved more than anxiety; sleeping has improved. -continue regular exercise. -tolerating Zoloft with minimal side effects.   No orders of the defined types were placed in this encounter.   Meds ordered this encounter  Medications  . sertraline (ZOLOFT) 100 MG tablet    Sig: Take 1 tablet (100 mg total) by mouth at bedtime.    Dispense:  30 tablet    Refill:  5    Return in about 8 weeks (around 01/21/2016) for recheck anxiety/depression.    Kristi Paulita Fujita, M.D. [facility] [address] [place], [place]  [address] [phone] phone [phone] fax

## 2015-12-12 ENCOUNTER — Other Ambulatory Visit: Payer: Self-pay | Admitting: Family Medicine

## 2016-01-26 ENCOUNTER — Ambulatory Visit: Payer: Federal, State, Local not specified - PPO | Admitting: Family Medicine

## 2016-02-10 ENCOUNTER — Ambulatory Visit (INDEPENDENT_AMBULATORY_CARE_PROVIDER_SITE_OTHER): Payer: Federal, State, Local not specified - PPO | Admitting: Family Medicine

## 2016-02-10 ENCOUNTER — Encounter: Payer: Self-pay | Admitting: Family Medicine

## 2016-02-10 VITALS — BP 119/77 | HR 74 | Temp 98.4°F | Resp 16 | Ht 70.75 in | Wt 220.0 lb

## 2016-02-10 DIAGNOSIS — F419 Anxiety disorder, unspecified: Principal | ICD-10-CM

## 2016-02-10 DIAGNOSIS — F4310 Post-traumatic stress disorder, unspecified: Secondary | ICD-10-CM | POA: Diagnosis not present

## 2016-02-10 DIAGNOSIS — F329 Major depressive disorder, single episode, unspecified: Secondary | ICD-10-CM

## 2016-02-10 DIAGNOSIS — G4700 Insomnia, unspecified: Secondary | ICD-10-CM

## 2016-02-10 DIAGNOSIS — F418 Other specified anxiety disorders: Secondary | ICD-10-CM | POA: Diagnosis not present

## 2016-02-10 DIAGNOSIS — N3281 Overactive bladder: Secondary | ICD-10-CM | POA: Diagnosis not present

## 2016-02-10 NOTE — Progress Notes (Signed)
Subjective:    Patient ID: Nicholas Huang, male    DOB: 11/12/1951, 65 y.o.   MRN: [id number]  02/10/2016  Follow-up   HPI This 65 y.o. male presents for two month follow-up of anxiety/depression, insomnia.  Increased Zoloft to 100mg  daily at last visit to help with anxiety.  No longer getting stressed out.  Everything is held down.  No excessive worry; no irritable or short tempered.  Less tearful now.  Sadness is better.  Sec drive is good.  Erections are fine; delayed ejaculation one time.  75% improved.  Still seeing therapist every other week; was weekly.  No SI/HI.  Sleeping better last couple of weeks; bedtime 11:30pm; falling asleep well; staying asleep.  Not taking sleep medicine regularly.     Overactive bladder: chronic; goes to bathroom all the time.  Concerned that due to medicines took in [place].  Provided with medication; made constipated; not prostate enlargement.  Diagnosed with BPH years ago.  Followed by Dr. Logan BoresEvans; sees him once per year.  Went to urologist last year; no evidence of BPH.  No excessive caffeine.  When plays golf, no problems.  When goes home, tea will make worse.      Review of Systems  Constitutional: Negative for fever, chills, diaphoresis, activity change, appetite change and fatigue.  Respiratory: Negative for cough and shortness of breath.   Cardiovascular: Negative for chest pain, palpitations and leg swelling.  Gastrointestinal: Negative for nausea, vomiting, abdominal pain and diarrhea.  Endocrine: Negative for cold intolerance, heat intolerance, polydipsia, polyphagia and polyuria.  Skin: Negative for color change, rash and wound.  Neurological: Negative for dizziness, tremors, seizures, syncope, facial asymmetry, speech difficulty, weakness, light-headedness, numbness and headaches.  Psychiatric/Behavioral: Negative for sleep disturbance and dysphoric mood. The patient is not nervous/anxious.     Past Medical History  Diagnosis Date  .  Cataract   . GERD (gastroesophageal reflux disease)   . Anxiety   . Herpes labialis    Past Surgical History  Procedure Laterality Date  . Refractive surgery     No Known Allergies Current Outpatient Prescriptions  Medication Sig Dispense Refill  . sertraline (ZOLOFT) 100 MG tablet Take 1 tablet (100 mg total) by mouth at bedtime. 30 tablet 5  . traZODone (DESYREL) 50 MG tablet TAKE 1/2 TO 1 TABLET BY MOUTH AT BEDTIME AS NEEDED SLEEP 30 tablet 0  . triamcinolone cream (KENALOG) 0.1 % Apply 1 application topically 2 (two) times daily. 30 g 1  . zolpidem (AMBIEN) 5 MG tablet Take 1 tablet (5 mg total) by mouth at bedtime as needed for sleep. 15 tablet 1   No current facility-administered medications for this visit.   Social History   Social History  . Marital Status: Divorced    Spouse Name: N/A  . Number of Children: N/A  . Years of Education: N/A   Occupational History  . 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   Social History Main Topics  . Smoking status: Never Smoker   . Smokeless tobacco: Not on file  . Alcohol Use: No  . Drug Use: No  . Sexual Activity: Yes   Other Topics Concern  . Not on file   Social History Narrative   Marital status; divorced     Children: 2 children; 7 grandchildren      Lives: alone      Tobacco: none      Alcohol: none      Drugs; none  Exercise: gym; weights; stepper; twice weekly.     Family History  Problem Relation Age of Onset  . Hypertension Mother   . Diabetes Mother        Objective:    BP 119/77 mmHg  Pulse 74  Temp(Src) 98.4 F (36.9 C) (Oral)  Resp 16  Ht 5' 10.75" (1.797 m)  Wt 220 lb (99.791 kg)  BMI 30.90 kg/m2 Physical Exam  Constitutional: He is oriented to person, place, and time. He appears well-developed and well-nourished. No distress.  HENT:  Head: Normocephalic and atraumatic.  Right Ear: External ear normal.  Left Ear: External ear normal.  Nose: Nose normal.  Mouth/Throat:  Oropharynx is clear and moist.  Eyes: Conjunctivae and EOM are normal. Pupils are equal, round, and reactive to light.  Neck: Normal range of motion. Neck supple. Carotid bruit is not present. No thyromegaly present.  Cardiovascular: Normal rate, regular rhythm, normal heart sounds and intact distal pulses.  Exam reveals no gallop and no friction rub.   No murmur heard. Pulmonary/Chest: Effort normal and breath sounds normal. He has no wheezes. He has no rales.  Abdominal: Soft. Bowel sounds are normal. He exhibits no distension and no mass. There is no tenderness. There is no rebound and no guarding.  Lymphadenopathy:    He has no cervical adenopathy.  Neurological: He is alert and oriented to person, place, and time. No cranial nerve deficit.  Skin: Skin is warm and dry. No rash noted. He is not diaphoretic.  Psychiatric: He has a normal mood and affect. His behavior is normal.  Nursing note and vitals reviewed.  No results found for this or any previous visit.     Assessment & Plan:   1. Anxiety and depression   2. PTSD (post-traumatic stress disorder)   3. Insomnia   4. Overactive bladder     Orders Placed This Encounter  Procedures  . Care order/instruction    AVS and GO    Scheduling Instructions:     AVS and GO   No orders of the defined types were placed in this encounter.    Return in about 6 months (around 08/12/2016) for recheck anxiety/depression, PTSD.    Karri Kallenbach Paulita Fujita, M.D. [facility] [address] [place], [place]  [address] [phone] phone [phone] fax

## 2016-02-17 DIAGNOSIS — N400 Enlarged prostate without lower urinary tract symptoms: Secondary | ICD-10-CM | POA: Insufficient documentation

## 2016-05-12 ENCOUNTER — Other Ambulatory Visit: Payer: Self-pay | Admitting: Family Medicine

## 2016-07-10 ENCOUNTER — Other Ambulatory Visit: Payer: Self-pay | Admitting: Family Medicine

## 2016-08-17 ENCOUNTER — Ambulatory Visit: Payer: Self-pay | Admitting: Family Medicine

## 2017-02-09 ENCOUNTER — Ambulatory Visit (INDEPENDENT_AMBULATORY_CARE_PROVIDER_SITE_OTHER): Payer: Federal, State, Local not specified - PPO | Admitting: Family Medicine

## 2017-02-09 ENCOUNTER — Encounter: Payer: Self-pay | Admitting: Family Medicine

## 2017-02-09 ENCOUNTER — Ambulatory Visit: Payer: Federal, State, Local not specified - PPO | Admitting: Family Medicine

## 2017-02-09 VITALS — BP 117/70 | HR 84 | Temp 98.1°F | Resp 16 | Ht 70.0 in | Wt 233.0 lb

## 2017-02-09 DIAGNOSIS — F419 Anxiety disorder, unspecified: Principal | ICD-10-CM

## 2017-02-09 DIAGNOSIS — Z6833 Body mass index (BMI) 33.0-33.9, adult: Secondary | ICD-10-CM

## 2017-02-09 DIAGNOSIS — E6609 Other obesity due to excess calories: Secondary | ICD-10-CM | POA: Insufficient documentation

## 2017-02-09 DIAGNOSIS — F418 Other specified anxiety disorders: Secondary | ICD-10-CM

## 2017-02-09 DIAGNOSIS — F4310 Post-traumatic stress disorder, unspecified: Secondary | ICD-10-CM

## 2017-02-09 DIAGNOSIS — F32A Depression, unspecified: Secondary | ICD-10-CM

## 2017-02-09 DIAGNOSIS — F329 Major depressive disorder, single episode, unspecified: Secondary | ICD-10-CM

## 2017-02-09 MED ORDER — SERTRALINE HCL 100 MG PO TABS: ORAL_TABLET | ORAL | 5 refills | Status: DC

## 2017-02-09 NOTE — Patient Instructions (Addendum)
   IF you received an x-ray today, you will receive an invoice from [facility]. Please contact [facility] at [phone] with questions or concerns regarding your invoice.   IF you received labwork today, you will receive an invoice from [facility]. Please contact [facility] at [phone] with questions or concerns regarding your invoice.   Our billing staff will not be able to assist you with questions regarding bills from these companies.  You will be contacted with the lab results as soon as they are available. The fastest way to get your results is to activate your My Chart account. Instructions are located on the last page of this paperwork. If you have not heard from us regarding the results in 2 weeks, please contact this office.      Major Depressive Disorder, Adult Major depressive disorder (MDD) is a mental health condition. MDD often makes you feel sad, hopeless, or helpless. MDD can also cause symptoms in your body. MDD can affect your:  Work.  School.  Relationships.  Other normal activities.  MDD can range from mild to very bad. It may occur once (single episode MDD). It can also occur many times (recurrent MDD). The main symptoms of MDD often include:  Feeling sad, depressed, or irritable most of the time.  Loss of interest.  MDD symptoms also include:  Sleeping too much or too little.  Eating too much or too little.  A change in your weight.  Feeling tired (fatigue) or having low energy.  Feeling worthless.  Feeling guilty.  Trouble making decisions.  Trouble thinking clearly.  Thoughts of suicide or harming others.  Feeling weak.  Feeling agitated.  Keeping yourself from being around other people (isolation).  Follow these instructions at home: Activity  Do these things as told by your doctor: ? Go back to your normal activities. ? Exercise regularly. ? Spend time outdoors. Alcohol  Talk with your doctor about  how alcohol can affect your antidepressant medicines.  Do not drink alcohol. Or, limit how much alcohol you drink. ? This means no more than 1 drink a day for nonpregnant women and 2 drinks a day for men. One drink equals one of these:  12 oz of beer.  5 oz of wine.  1 oz of hard liquor. General instructions  Take over-the-counter and prescription medicines only as told by your doctor.  Eat a healthy diet.  Get plenty of sleep.  Find activities that you enjoy. Make time to do them.  Think about joining a support group. Your doctor may be able to suggest a group for you.  Keep all follow-up visits as told by your doctor. This is important. Where to find more information:  National Alliance on Mental Illness: ? www.nami.org  U.S[place] National Institute of Mental Health: ? www.nimh.nih.gov  National Suicide Prevention Lifeline: ? 1-800-273-8255. This is free, 24-hour help. Contact a doctor if:  Your symptoms get worse.  You have new symptoms. Get help right away if:  You self-harm.  You see, hear, taste, smell, or feel things that are not present (hallucinate). If you ever feel like you may hurt yourself or others, or have thoughts about taking your own life, get help right away. You can go to your nearest emergency department or call:  Your local emergency services (911 in the [place]  A suicide crisis helpline, such as the National Suicide Prevention Lifeline: ? 1-800-273-8255. This is open 24 hours a day.  This information is not intended to replace   advice given to you by your health care provider. Make sure you discuss any questions you have with your health care provider. Document Released: 10/13/2015 Document Revised: 07/18/2016 Document Reviewed: 07/18/2016 Elsevier Interactive Patient Education  2017 Elsevier Inc.  

## 2017-02-09 NOTE — Progress Notes (Signed)
Subjective:    Patient ID: Nicholas Huang, male    DOB: 1951-05-12, 66 y.o.   MRN: [id number]  02/09/2017  Follow-up (med check/ zoloft)   HPI This 66 y.o. male presents for one year follow-up evaluation of anxiety and depression. Patient reports good compliance with medication, good tolerance to medication, and good symptom control.  Doing well emotionally; rare bad or sad days.  Anger has greatly decreased.  Sleeping well most nights.  Continues to be in relationship and overall stable; argues a lot.  Not living with girlfriend.  Retired. Exercises at gym several times per day. Retired from [employer] in 2015 after 21 years of service.  Followed annually at [facility] for physicals.  Usually undergoes physicals in June during birthday month.  Denies SI.  Continues to see therapist once per month.  Rarely sees children who live out of state.  This causes some stress for patient.    Immunization History  Administered Date(s) Administered  . Influenza-Unspecified 08/16/2015, 08/15/2016   BP Readings from Last 3 Encounters:  02/09/17 117/70  02/10/16 119/77  11/26/15 110/70   Wt Readings from Last 3 Encounters:  02/09/17 233 lb (105.7 kg)  02/10/16 220 lb (99.8 kg)  11/26/15 220 lb 6.4 oz (100 kg)    Review of Systems  Constitutional: Negative for activity change, appetite change, chills, diaphoresis, fatigue and fever.  Eyes: Negative for visual disturbance.  Respiratory: Negative for cough and shortness of breath.   Cardiovascular: Negative for chest pain, palpitations and leg swelling.  Endocrine: Negative for cold intolerance, heat intolerance, polydipsia, polyphagia and polyuria.  Neurological: Negative for dizziness, tremors, seizures, syncope, facial asymmetry, speech difficulty, weakness, light-headedness, numbness and headaches.  Psychiatric/Behavioral: Negative for dysphoric mood, self-injury, sleep disturbance and suicidal ideas. The patient is not nervous/anxious.      Past Medical History:  Diagnosis Date  . Anxiety   . Cataract   . GERD (gastroesophageal reflux disease)   . Herpes labialis   . Overactive bladder    followed by urology/[facility] yearly   Past Surgical History:  Procedure Laterality Date  . REFRACTIVE SURGERY     No Known Allergies  Social History   Social History  . Marital status: Divorced    Spouse name: N/A  . Number of children: 2  . Years of education: N/A   Occupational History  . 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   Social History Main Topics  . Smoking status: Never Smoker  . Smokeless tobacco: Never Used  . Alcohol use No  . Drug use: No  . Sexual activity: Yes   Other Topics Concern  . Not on file   Social History Narrative   Marital status; divorced; dating in 2018     Children: 2 children; 7 grandchildren      Lives: alone      Employment: retired in 2015; retired Research officer, political party x 21 years      Tobacco: none      Alcohol: none      Drugs; none      Exercise: gym; weights; stepper; twice weekly.     Family History  Problem Relation Age of Onset  . Hypertension Mother   . Diabetes Mother   . Alcohol abuse Father        Objective:    BP 117/70   Pulse 84   Temp 98.1 F (36.7 C) (Oral)   Resp 16   Ht 5\' 10"  (1.778 m)   Wt  233 lb (105.7 kg)   SpO2 95%   BMI 33.43 kg/m  Physical Exam  Constitutional: He is oriented to person, place, and time. He appears well-developed and well-nourished. No distress.  HENT:  Head: Normocephalic and atraumatic.  Right Ear: External ear normal.  Left Ear: External ear normal.  Nose: Nose normal.  Mouth/Throat: Oropharynx is clear and moist.  Eyes: Conjunctivae and EOM are normal. Pupils are equal, round, and reactive to light.  Neck: Normal range of motion. Neck supple. Carotid bruit is not present. No thyromegaly present.  Cardiovascular: Normal rate, regular rhythm, normal heart sounds and intact distal pulses.  Exam reveals no gallop  and no friction rub.   No murmur heard. Pulmonary/Chest: Effort normal and breath sounds normal. He has no wheezes. He has no rales.  Abdominal: Soft. Bowel sounds are normal. He exhibits no distension and no mass. There is no tenderness. There is no rebound and no guarding.  Lymphadenopathy:    He has no cervical adenopathy.  Neurological: He is alert and oriented to person, place, and time. No cranial nerve deficit.  Skin: Skin is warm and dry. No rash noted. He is not diaphoretic.  Psychiatric: He has a normal mood and affect. His behavior is normal.  Nursing note and vitals reviewed.  No results found for this or any previous visit.     [facility] 02/09/2017 02/10/2016 11/26/2015 10/22/2015 10/10/2015  Decreased Interest 0 0 0 1 2  Down, Depressed, Hopeless 0 0 0 2 2  PHQ - 2 Score 0 0 0 3 4  Altered sleeping - - 0 1 3  Tired, decreased energy - - 0 0 0  Change in appetite - - 0 2 2  Feeling bad or failure about yourself  - - 0 0 2  Trouble concentrating - - 0 2 3  Moving slowly or fidgety/restless - - 0 2 2  Suicidal thoughts - - 0 0 0  PHQ-9 Score - - 0 10 16  Difficult doing work/chores - - Not difficult at all Not difficult at all Somewhat difficult   Fall Risk  02/09/2017 02/10/2016  Falls in the past year? No No    Assessment & Plan:   1. Anxiety and depression   2. PTSD (post-traumatic stress disorder)   3. Class 1 obesity due to excess calories without serious comorbidity with body mass index (BMI) of 33.0 to 33.9 in adult    -stable on Zoloft 100mg  daily. -continue psychotherapy once monthly. -continue regular exercise. -recommend weight loss, exercise.  No orders of the defined types were placed in this encounter.  Meds ordered this encounter  Medications  . sertraline (ZOLOFT) 100 MG tablet    Sig: TAKE 1 TABLET (100 MG TOTAL) BY MOUTH AT BEDTIME.    Dispense:  30 tablet    Refill:  5    Return in about 9 months (around 11/11/2017) for  recheck depression/anxiety.   Lynnox Girten Paulita FujitaMartin Yanelli Zapanta, M.D. [facility]  [place] previously [facility] [address] [place]  [id number] [phone] phone [phone] fax

## 2017-06-29 ENCOUNTER — Ambulatory Visit (INDEPENDENT_AMBULATORY_CARE_PROVIDER_SITE_OTHER): Payer: Federal, State, Local not specified - PPO | Admitting: Family Medicine

## 2017-06-29 ENCOUNTER — Encounter: Payer: Self-pay | Admitting: Family Medicine

## 2017-06-29 VITALS — BP 133/79 | HR 69 | Temp 98.0°F | Resp 16 | Ht 72.05 in | Wt 228.0 lb

## 2017-06-29 DIAGNOSIS — F5104 Psychophysiologic insomnia: Secondary | ICD-10-CM

## 2017-06-29 DIAGNOSIS — F4310 Post-traumatic stress disorder, unspecified: Secondary | ICD-10-CM

## 2017-06-29 DIAGNOSIS — F419 Anxiety disorder, unspecified: Secondary | ICD-10-CM | POA: Diagnosis not present

## 2017-06-29 DIAGNOSIS — F329 Major depressive disorder, single episode, unspecified: Secondary | ICD-10-CM | POA: Diagnosis not present

## 2017-06-29 DIAGNOSIS — F32A Depression, unspecified: Secondary | ICD-10-CM

## 2017-06-29 MED ORDER — SERTRALINE HCL 100 MG PO TABS: 200.0000 mg | ORAL_TABLET | Freq: Every day | ORAL | 5 refills | Status: DC

## 2017-06-29 NOTE — Progress Notes (Signed)
Subjective:    Patient ID: Nicholas Huang, male    DOB: Apr 19, 1951, 66 y.o.   MRN: [id number]  06/29/2017  Depression (follow-up) and Anxiety   HPI This 66 y.o. male presents for six month follow-up for anxiety and depression.  Patient reports good compliance with medication, good tolerance to medication, and good symptom control.   Mother passed away in 01/16/2017.   Saw therapist last month; lacking motivation; isolating self.  Not exercising.  No SI.  No interest.  Not taking anything for sleep right now.  Still seeing girlfriend. Does not live together. Overwhelmed.  Did not go in July; called and cancelled appointment with therapist.  Asked to be rescheduled.  [facility].  [facility] colonoscopy.    BP Readings from Last 3 Encounters:  06/29/17 (!) 145/81  02/09/17 117/70  02/10/16 119/77   Wt Readings from Last 3 Encounters:  06/29/17 228 lb (103.4 kg)  02/09/17 233 lb (105.7 kg)  02/10/16 220 lb (99.8 kg)   Immunization History  Administered Date(s) Administered  . Influenza-Unspecified 08/16/2015, 08/15/2016  . Pneumococcal Conjugate-13 06/24/2017  . Zoster 11/15/2013    Review of Systems  Constitutional: Negative for activity change, appetite change, chills, diaphoresis, fatigue and fever.  Respiratory: Negative for cough and shortness of breath.   Cardiovascular: Negative for chest pain, palpitations and leg swelling.  Gastrointestinal: Negative for abdominal pain, diarrhea, nausea and vomiting.  Endocrine: Negative for cold intolerance, heat intolerance, polydipsia, polyphagia and polyuria.  Skin: Negative for color change, rash and wound.  Neurological: Negative for dizziness, tremors, seizures, syncope, facial asymmetry, speech difficulty, weakness, light-headedness, numbness and headaches.  Psychiatric/Behavioral: Positive for dysphoric mood. Negative for sleep disturbance. The patient is not nervous/anxious.     Past Medical History:  Diagnosis Date  .  Anxiety   . Cataract   . GERD (gastroesophageal reflux disease)   . Herpes labialis   . Overactive bladder    followed by urology/[facility] yearly   Past Surgical History:  Procedure Laterality Date  . REFRACTIVE SURGERY     No Known Allergies  Social History   Social History  . Marital status: Divorced    Spouse name: N/A  . Number of children: 2  . Years of education: N/A   Occupational History  . 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   Social History Main Topics  . Smoking status: Never Smoker  . Smokeless tobacco: Never Used  . Alcohol use No  . Drug use: No  . Sexual activity: Yes   Other Topics Concern  . Not on file   Social History Narrative   Marital status; divorced; dating in 2018     Children: 2 children; 7 grandchildren      Lives: alone      Employment: retired in 2015; retired Research officer, political party x 21 years      Tobacco: none      Alcohol: none      Drugs; none      Exercise: gym; weights; stepper; twice weekly.     Family History  Problem Relation Age of Onset  . Hypertension Mother   . Diabetes Mother   . Alcohol abuse Father        Objective:    BP (!) 145/81   Pulse 69   Temp 98 F (36.7 C) (Oral)   Resp 16   Ht 6' 0.05" (1.83 m)   Wt 228 lb (103.4 kg)   SpO2 98%   BMI  30.88 kg/m  Physical Exam  Constitutional: He is oriented to person, place, and time. He appears well-developed and well-nourished. No distress.  HENT:  Head: Normocephalic and atraumatic.  Right Ear: External ear normal.  Left Ear: External ear normal.  Nose: Nose normal.  Mouth/Throat: Oropharynx is clear and moist.  Eyes: Pupils are equal, round, and reactive to light. Conjunctivae and EOM are normal.  Neck: Normal range of motion. Neck supple. Carotid bruit is not present. No thyromegaly present.  Cardiovascular: Normal rate, regular rhythm, normal heart sounds and intact distal pulses.  Exam reveals no gallop and no friction rub.   No murmur  heard. Pulmonary/Chest: Effort normal and breath sounds normal. He has no wheezes. He has no rales.  Abdominal: Soft. Bowel sounds are normal. He exhibits no distension and no mass. There is no tenderness. There is no rebound and no guarding.  Lymphadenopathy:    He has no cervical adenopathy.  Neurological: He is alert and oriented to person, place, and time. No cranial nerve deficit.  Skin: Skin is warm and dry. No rash noted. He is not diaphoretic.  Psychiatric: He has a normal mood and affect. His behavior is normal. Thought content normal.  Nursing note and vitals reviewed.  No results found for this or any previous visit. No results found. Depression screen [facility] 2/9 06/29/2017 02/09/2017 02/10/2016 11/26/2015 10/22/2015  Decreased Interest 1 0 0 0 1  Down, Depressed, Hopeless 1 0 0 0 2  PHQ - 2 Score 2 0 0 0 3  Altered sleeping 1 - - 0 1  Tired, decreased energy 1 - - 0 0  Change in appetite - - - 0 2  Feeling bad or failure about yourself  1 - - 0 0  Trouble concentrating 0 - - 0 2  Moving slowly or fidgety/restless 0 - - 0 2  Suicidal thoughts 0 - - 0 0  PHQ-9 Score 5 - - 0 10  Difficult doing work/chores - - - Not difficult at all Not difficult at all   Fall Risk  06/29/2017 02/09/2017 02/10/2016  Falls in the past year? No No No        Assessment & Plan:   1. Anxiety and depression   2. PTSD (post-traumatic stress disorder)   3. Psychophysiological insomnia    -worsening anxiety and depression due to family stressors; increase Sertraline to 200mg  daily per patient request as reluctant to try a different medication at this time. -advised to see therapist every other week for now. -advised to start exercise program at least three days per week for depression management. -prolonged face-to-face for 25 minutes with greater than 50% of time dedicated to counseling and coordination of care.   No orders of the defined types were placed in this encounter.  Meds ordered this  encounter  Medications  . sertraline (ZOLOFT) 100 MG tablet    Sig: Take 2 tablets (200 mg total) by mouth at bedtime.    Dispense:  60 tablet    Refill:  5    Return in about 6 weeks (around 08/10/2017) for recheck depression/anxiety.   Kristi Paulita FujitaMartin Smith, M.D. [facility]  [place] [facility] [address] [place], [place]  [phone] phone [phone] fax

## 2017-06-29 NOTE — Patient Instructions (Addendum)
   GOAL: EXERCISE 3 DAYS PER WEEK. CALL THERAPIST TOMORROW FOR AN APPOINTMENT; SEE HIM ONCE MONTHLY. SEE DR. Katrinka BlazingSMITH IN SIX WEEKS.   IF you received an x-ray today, you will receive an invoice [facility]. Please contact [facility] at [phone] with questions or concerns regarding your invoice.   IF you received labwork today, you will receive an invoice from [facility]. Please contact [facility] at [phone] with questions or concerns regarding your invoice.   Our billing staff will not be able to assist you with questions regarding bills from these companies.  You will be contacted with the lab results as soon as they are available. The fastest way to get your results is to activate your My Chart account. Instructions are located on the last page of this paperwork. If you have not heard from us regarding the results in 2 weeks, please contact this office.

## 2017-08-10 ENCOUNTER — Ambulatory Visit: Payer: Federal, State, Local not specified - PPO | Admitting: Family Medicine

## 2017-09-22 ENCOUNTER — Other Ambulatory Visit: Payer: Self-pay | Admitting: Family Medicine

## 2017-11-09 ENCOUNTER — Encounter: Payer: Self-pay | Admitting: Family Medicine

## 2017-11-09 ENCOUNTER — Ambulatory Visit: Payer: Federal, State, Local not specified - PPO | Admitting: Family Medicine

## 2017-11-09 ENCOUNTER — Other Ambulatory Visit: Payer: Self-pay

## 2017-11-09 VITALS — BP 130/82 | HR 87 | Temp 98.0°F | Resp 16 | Ht 72.05 in | Wt 233.0 lb

## 2017-11-09 DIAGNOSIS — F4310 Post-traumatic stress disorder, unspecified: Secondary | ICD-10-CM | POA: Diagnosis not present

## 2017-11-09 DIAGNOSIS — F329 Major depressive disorder, single episode, unspecified: Secondary | ICD-10-CM

## 2017-11-09 DIAGNOSIS — F32A Depression, unspecified: Secondary | ICD-10-CM

## 2017-11-09 DIAGNOSIS — F419 Anxiety disorder, unspecified: Secondary | ICD-10-CM

## 2017-11-09 DIAGNOSIS — F5104 Psychophysiologic insomnia: Secondary | ICD-10-CM

## 2017-11-09 MED ORDER — SERTRALINE HCL 100 MG PO TABS: 200.0000 mg | ORAL_TABLET | Freq: Every day | ORAL | 1 refills | Status: DC

## 2017-11-09 NOTE — Patient Instructions (Signed)
     IF you received an x-ray today, you will receive an invoice from [facility]. Please contact  Radiology at [phone] with questions or concerns regarding your invoice.   IF you received labwork today, you will receive an invoice from [facility]. Please contact [facility] at [phone] with questions or concerns regarding your invoice.   Our billing staff will not be able to assist you with questions regarding bills from these companies.  You will be contacted with the lab results as soon as they are available. The fastest way to get your results is to activate your My Chart account. Instructions are located on the last page of this paperwork. If you have not heard from us regarding the results in 2 weeks, please contact this office.     

## 2017-11-09 NOTE — Progress Notes (Signed)
Subjective:    Patient ID: Dimitri PedFreeman Derhammer, male    DOB: 11/17/1950, 66 y.o.   MRN: [id number]  11/09/2017  Depression (follow-up ); Anxiety; and Post-Traumatic Stress Disorder    HPI This 66 y.o. male presents for four month follow-up evaluation of anxiety and depression and PTSD.  Increased Sertraline to 200mg  daily in August 2018. Management changes made at last visit included: -worsening anxiety and depression due to family stressors; increase Sertraline to 200mg  daily per patient request as reluctant to try a different medication at this time. -advised to see therapist every other week for now. -advised to start exercise program at least three days per week for depression management  Feeling much better emotionally since last visit in August 2018. Going back to dancing [employer]. Singing lessons; [place]. New therapist; male therapist.  Seeing her once per month; started seeing her for two months.  Exercising 2-3 times per week; [employer] alone.   Playing golf.   Went back to church. Still has same girlfriend.  Dating four years.   No side effects. Sleeping well.  Six hours per night.  No medications for sleep.   BP Readings from Last 3 Encounters:  11/09/17 130/82  06/29/17 133/79  02/09/17 117/70   Wt Readings from Last 3 Encounters:  11/09/17 233 lb (105.7 kg)  06/29/17 228 lb (103.4 kg)  02/09/17 233 lb (105.7 kg)   Immunization History  Administered Date(s) Administered  . Influenza-Unspecified 08/16/2015, 08/15/2016, 08/15/2017  . Pneumococcal Conjugate-13 06/24/2017  . Zoster 11/15/2013    Review of Systems  Constitutional: Negative for activity change, appetite change, chills, diaphoresis, fatigue and fever.  Respiratory: Negative for cough and shortness of breath.   Cardiovascular: Negative for chest pain, palpitations and leg swelling.  Gastrointestinal: Negative for abdominal pain, diarrhea, nausea and vomiting.    Endocrine: Negative for cold intolerance, heat intolerance, polydipsia, polyphagia and polyuria.  Skin: Negative for color change, rash and wound.  Neurological: Negative for dizziness, tremors, seizures, syncope, facial asymmetry, speech difficulty, weakness, light-headedness, numbness and headaches.  Psychiatric/Behavioral: Negative for dysphoric mood and sleep disturbance. The patient is not nervous/anxious.     Past Medical History:  Diagnosis Date  . Anxiety   . Cataract   . GERD (gastroesophageal reflux disease)   . Herpes labialis   . Overactive bladder    followed by urology/[facility] yearly   Past Surgical History:  Procedure Laterality Date  . REFRACTIVE SURGERY     No Known Allergies Current Outpatient Medications on File Prior to Visit  Medication Sig Dispense Refill  . tamsulosin (FLOMAX) 0.4 MG CAPS capsule Take 0.4 mg by mouth daily.     No current facility-administered medications on file prior to visit.    Social History   Socioeconomic History  . Marital status: Divorced    Spouse name: Not on file  . Number of children: 2  . Years of education: Not on file  . Highest education level: Not on file  Social Needs  . Financial resource strain: Not on file  . Food insecurity - worry: Not on file  . Food insecurity - inability: Not on file  . Transportation needs - medical: Not on file  . Transportation needs - non-medical: Not on file  Occupational History  . Occupation: retired    Associate Professormployer: [employer]    Comment: Medical illustratormail handler/sorter.  Tobacco Use  . Smoking status: Never Smoker  . Smokeless tobacco: Never Used  Substance and Sexual  Activity  . Alcohol use: No  . Drug use: No  . Sexual activity: Yes  Other Topics Concern  . Not on file  Social History Narrative   Marital status; divorced; dating in 2018     Children: 2 children; 7 grandchildren      Lives: alone      Employment: retired in 2015; retired Research officer, political partypostal service x 21 years      Tobacco:  none      Alcohol: none      Drugs; none      Exercise: gym; weights; stepper; twice weekly.     Family History  Problem Relation Age of Onset  . Hypertension Mother   . Diabetes Mother   . Alcohol abuse Father        Objective:    BP 130/82   Pulse 87   Temp 98 F (36.7 C) (Oral)   Resp 16   Ht 6' 0.05" (1.83 m)   Wt 233 lb (105.7 kg)   SpO2 98%   BMI 31.56 kg/m  Physical Exam  Constitutional: He is oriented to person, place, and time. He appears well-developed and well-nourished. No distress.  HENT:  Head: Normocephalic and atraumatic.  Right Ear: External ear normal.  Left Ear: External ear normal.  Nose: Nose normal.  Mouth/Throat: Oropharynx is clear and moist.  Eyes: Conjunctivae and EOM are normal. Pupils are equal, round, and reactive to light.  Neck: Normal range of motion. Neck supple. Carotid bruit is not present. No thyromegaly present.  Cardiovascular: Normal rate, regular rhythm, normal heart sounds and intact distal pulses. Exam reveals no gallop and no friction rub.  No murmur heard. Pulmonary/Chest: Effort normal and breath sounds normal. He has no wheezes. He has no rales.  Abdominal: Soft. Bowel sounds are normal. He exhibits no distension and no mass. There is no tenderness. There is no rebound and no guarding.  Lymphadenopathy:    He has no cervical adenopathy.  Neurological: He is alert and oriented to person, place, and time. No cranial nerve deficit.  Skin: Skin is warm and dry. No rash noted. He is not diaphoretic.  Psychiatric: He has a normal mood and affect. His behavior is normal.  Nursing note and vitals reviewed.  No results found. [facility] 2/9 11/09/2017 06/29/2017 02/09/2017 02/10/2016 11/26/2015  Decreased Interest 0 1 0 0 0  Down, Depressed, Hopeless 0 1 0 0 0  PHQ - 2 Score 0 2 0 0 0  Altered sleeping - 1 - - 0  Tired, decreased energy - 1 - - 0  Change in appetite - - - - 0  Feeling bad or failure about yourself  - 1  - - 0  Trouble concentrating - 0 - - 0  Moving slowly or fidgety/restless - 0 - - 0  Suicidal thoughts - 0 - - 0  PHQ-9 Score - 5 - - 0  Difficult doing work/chores - - - - Not difficult at all   Fall Risk  11/09/2017 06/29/2017 02/09/2017 02/10/2016  Falls in the past year? No No No No        Assessment & Plan:   1. PTSD (post-traumatic stress disorder)   2. Psychophysiological insomnia   3. Anxiety and depression    Anxiety and depression significantly improved with increase in sertraline dose to 200 mg daily.  No change therapy at this time.  Encouraged ongoing psychotherapy to address posttraumatic stress disorder and personal stressors.  Sleeping well at this time.  Encourage regular exercise for anxiety and depression management.  No orders of the defined types were placed in this encounter.  Meds ordered this encounter  Medications  . sertraline (ZOLOFT) 100 MG tablet    Sig: Take 2 tablets (200 mg total) by mouth at bedtime.    Dispense:  180 tablet    Refill:  1    No Follow-up on file.   Kristi Paulita Fujita, M.D. Primary Care at [facility] previously [facility] [address] [place], [place]  [address] [phone] phone [phone] fax

## 2018-01-03 ENCOUNTER — Telehealth: Payer: Self-pay | Admitting: Family Medicine

## 2018-01-03 NOTE — Telephone Encounter (Signed)
Called and left VM for pt trying to confirm apt tomorrow 01/04/18. Advised of time, building # and time policies. °

## 2018-01-03 NOTE — Progress Notes (Signed)
Subjective:    Patient ID: Nicholas Huang, male    DOB: 07-30-1951, 67 y.o.   MRN: [id number]  01/04/2018  Post-Traumatic Stress Disorder (follow-up )    HPI This 67 y.o. male presents for two month follow-up evaluation of PTSD, anxiety/depression, insomnia.  Last visit 11/08/17 with the following assessment and plan: Anxiety and depression significantly improved with increase in sertraline dose to 200 mg daily.  No change therapy at this time.  Encouraged ongoing psychotherapy to address posttraumatic stress disorder and personal stressors.  Sleeping well at this time.  Encourage regular exercise for anxiety and depression management.  Update since visit in 10/2017: Quit job; falling out with job. Part-time job at [employer]; quit job.  Stopped ballroom dancing. Taking a singing class. Still working out 2-32 days per week.  Still seeing therapist; new therapist; seeing once per month. No significant change from last visit.  Continues to struggle with family stressors and dynamics. [facility]  [facility]'s [facility] in [facility].  [facility] not [facility] of this increase in therapy and was concerned regarding decline in mental well-being.  BP Readings from Last 3 Encounters:  01/04/18 130/82  11/09/17 130/82  06/29/17 133/79   Wt Readings from Last 3 Encounters:  01/04/18 233 lb (105.7 kg)  11/09/17 233 lb (105.7 kg)  06/29/17 228 lb (103.4 kg)   Immunization History  Administered Date(s) Administered  . Influenza-Unspecified 08/16/2015, 08/15/2016, 08/15/2017  . Pneumococcal Conjugate-13 06/24/2017  . Zoster 11/15/2013    Review of Systems  Constitutional: Negative for activity change, appetite change, chills, diaphoresis, fatigue and fever.  Respiratory: Negative for cough and shortness of breath.   Cardiovascular: Negative for chest pain, palpitations and leg swelling.    Gastrointestinal: Negative for abdominal pain, diarrhea, nausea and vomiting.  Endocrine: Negative for cold intolerance, heat intolerance, polydipsia, polyphagia and polyuria.  Skin: Negative for color change, rash and wound.  Neurological: Negative for dizziness, tremors, seizures, syncope, facial asymmetry, speech difficulty, weakness, light-headedness, numbness and headaches.  Psychiatric/Behavioral: Positive for dysphoric mood. Negative for self-injury, sleep disturbance and suicidal ideas. The patient is nervous/anxious.     Past Medical History:  Diagnosis Date  . Anxiety   . Cataract   . GERD (gastroesophageal reflux disease)   . Herpes labialis   . Overactive bladder    followed by urology/[facility] yearly   Past Surgical History:  Procedure Laterality Date  . REFRACTIVE SURGERY     No Known Allergies Current Outpatient Medications on File Prior to Visit  Medication Sig Dispense Refill  . sertraline (ZOLOFT) 100 MG tablet Take 2 tablets (200 mg total) by mouth at bedtime. 180 tablet 1  . SHINGRIX injection     . tamsulosin (FLOMAX) 0.4 MG CAPS capsule Take 0.4 mg by mouth daily.     No current facility-administered medications on file prior to visit.    Social History   Socioeconomic History  . Marital status: Divorced    Spouse name: Not on file  . Number of children: 2  . Years of education: Not on file  . Highest education level: Not on file  Social Needs  . Financial resource strain: Not on file  . Food insecurity - worry: Not on file  . Food insecurity - inability: Not on file  . Transportation needs - medical: Not on file  . Transportation needs - non-medical: Not on file  Occupational History  .  Occupation: retired    Associate Professor: [employer]    Comment: Medical illustrator.  Tobacco Use  . Smoking status: Never Smoker  . Smokeless tobacco: Never Used  Substance and Sexual Activity  . Alcohol use: No  . Drug use: No  . Sexual activity: Yes  Other  Topics Concern  . Not on file  Social History Narrative   Marital status; divorced; dating in 2018     Children: 2 children; 7 grandchildren      Lives: alone      Employment: retired in 2015; retired Research officer, political party x 21 years      Tobacco: none      Alcohol: none      Drugs; none      Exercise: gym; weights; stepper; twice weekly.     Family History  Problem Relation Age of Onset  . Hypertension Mother   . Diabetes Mother   . Alcohol abuse Father        Objective:    BP 130/82   Pulse 69   Temp 98 F (36.7 C) (Oral)   Resp 16   Ht 6' 0.44" (1.84 m)   Wt 233 lb (105.7 kg)   SpO2 95%   BMI 31.22 kg/m  Physical Exam  Constitutional: He is oriented to person, place, and time. He appears well-developed and well-nourished. No distress.  Well-groomed.  Appears younger than stated age.  HENT:  Head: Normocephalic and atraumatic.  Right Ear: External ear normal.  Left Ear: External ear normal.  Nose: Nose normal.  Mouth/Throat: Oropharynx is clear and moist.  Eyes: Conjunctivae and EOM are normal. Pupils are equal, round, and reactive to light.  Neck: Normal range of motion. Neck supple. Carotid bruit is not present. No thyromegaly present.  Cardiovascular: Normal rate, regular rhythm, normal heart sounds and intact distal pulses. Exam reveals no gallop and no friction rub.  No murmur heard. Pulmonary/Chest: Effort normal and breath sounds normal. He has no wheezes. He has no rales.  Abdominal: Soft. Bowel sounds are normal. He exhibits no distension and no mass. There is no tenderness. There is no rebound and no guarding.  Lymphadenopathy:    He has no cervical adenopathy.  Neurological: He is alert and oriented to person, place, and time. No cranial nerve deficit. He exhibits normal muscle tone. Coordination normal.  Skin: Skin is warm and dry. No rash noted. He is not diaphoretic.  Psychiatric: He has a normal mood and affect. His behavior is normal. Judgment and thought  content normal.  Nursing note and vitals reviewed.  No results found. Depression screen [facility] 2/9 01/04/2018 11/09/2017 06/29/2017 02/09/2017 02/10/2016  Decreased Interest 1 0 1 0 0  Down, Depressed, Hopeless 1 0 1 0 0  PHQ - 2 Score 2 0 2 0 0  Altered sleeping 1 - 1 - -  Tired, decreased energy 1 - 1 - -  Change in appetite 1 - - - -  Feeling bad or failure about yourself  1 - 1 - -  Trouble concentrating 0 - 0 - -  Moving slowly or fidgety/restless 0 - 0 - -  Suicidal thoughts 0 - 0 - -  PHQ-9 Score 6 - 5 - -  Difficult doing work/chores - - - - -   Fall Risk  01/04/2018 11/09/2017 06/29/2017 02/09/2017 02/10/2016  Falls in the past year? No No No No No        Assessment & Plan:   1. Anxiety and depression  2. PTSD (post-traumatic stress disorder)   3. Psychophysiological insomnia     Stable anxiety and depression with improvement in the past 6 months.  Mother passed away 1 year ago and doing better at this time.  Continue sertraline 200 mg daily.  Continue with psychotherapy and encourage increase in therapy to once weekly to once every other week and frequency.  Continue regular exercise.  Recommend exercise 5-6 days/week.  Chronic posttraumatic stress disorder due to timing arm services.  Followed by [facility] system on a yearly basis.  I am happy to write a letter to [facility] provider indicating the recent increase in sertraline therapy occurred following the death of patient's mother.  Insomnia: Improved.  No recent stressors.  No longer warranting sleep aid.  No orders of the defined types were placed in this encounter.  No orders of the defined types were placed in this encounter.   No Follow-up on file.   Kristi Paulita Fujita, M.D. Primary Care at [facility] previously [facility] [address] [place], [place]  [address] [phone] phone [phone] fax

## 2018-01-04 ENCOUNTER — Ambulatory Visit: Payer: Federal, State, Local not specified - PPO | Admitting: Family Medicine

## 2018-01-04 ENCOUNTER — Other Ambulatory Visit: Payer: Self-pay

## 2018-01-04 ENCOUNTER — Encounter: Payer: Self-pay | Admitting: Family Medicine

## 2018-01-04 VITALS — BP 130/82 | HR 69 | Temp 98.0°F | Resp 16 | Ht 72.44 in | Wt 233.0 lb

## 2018-01-04 DIAGNOSIS — F4310 Post-traumatic stress disorder, unspecified: Secondary | ICD-10-CM | POA: Diagnosis not present

## 2018-01-04 DIAGNOSIS — F5104 Psychophysiologic insomnia: Secondary | ICD-10-CM | POA: Diagnosis not present

## 2018-01-04 DIAGNOSIS — F419 Anxiety disorder, unspecified: Secondary | ICD-10-CM

## 2018-01-04 DIAGNOSIS — F329 Major depressive disorder, single episode, unspecified: Secondary | ICD-10-CM

## 2018-01-04 DIAGNOSIS — F32A Depression, unspecified: Secondary | ICD-10-CM

## 2018-01-04 NOTE — Patient Instructions (Addendum)
   IF you received an x-ray today, you will receive an invoice from [facility]. Please contact [facility] at [phone] with questions or concerns regarding your invoice.   IF you received labwork today, you will receive an invoice from [facility]. Please contact [facility] at [phone] with questions or concerns regarding your invoice.   Our billing staff will not be able to assist you with questions regarding bills from these companies.  You will be contacted with the lab results as soon as they are available. The fastest way to get your results is to activate your My Chart account. Instructions are located on the last page of this paperwork. If you have not heard from us regarding the results in 2 weeks, please contact this office.     Living With Post-Traumatic Stress Disorder If you have been diagnosed with post-traumatic stress disorder (PTSD), you may be relieved that you now know why you have felt or behaved a certain way. Still, you may feel overwhelmed about the treatment ahead. You may also wonder how to get the support you need and how to deal with the condition day-to-day. If you are living with PTSD, there are ways to help you recover from it and manage your symptoms. How to manage lifestyle changes Managing stress Stress is your body's reaction to life changes and events, both good and bad. Stress can make PTSD worse. Take the following steps to cope with stress:  Talk with your health care provider or a counselor if you would like to learn more about techniques to reduce your stress. He or she may suggest some stress reduction techniques such as: ? Muscle relaxation exercises. ? Regular exercise. ? Meditation, yoga, or other mind-body exercises. ? Breathing exercises. ? Listening to quiet music. ? Spending time outside.  Maintain a healthy lifestyle. Eat a healthy diet, exercise regularly, get plenty of sleep, and take time to relax.  Spend time  with others. Talk with them about how you are feeling and what kind of support you need. Try to not isolate yourself, even though you may feel like doing that. Isolating yourself can delay your recovery.  Do activities and hobbies that you enjoy.  Pace yourself when doing stressful things. Take breaks, and reward yourself when you finish. Make sure that you do not overload your schedule.  Medicines Your health care provider may suggest certain medicines if he or she feels that they will help to improve your condition. Antidepressants or antipsychotic medicines may be used to treat PTSD. Avoid using alcohol and other substances that may prevent your medicines from working properly (may interact). It is also important to:  Talk with your pharmacist or health care provider about all medicines that you take, their possible side effects, and which medicines are safe to take together.  Make it your goal to take part in all treatment decisions (shared decision-making). Ask about possible side effects of medicines that your health care provider recommends, and tell him or her how you feel about having those side effects. It is best if shared decision-making with your health care provider is part of your total treatment plan.  If your health care provider prescribes a medicine, you may not notice the full benefits of it for 4-8 weeks. Most people who are treated for PTSD need to take medicine for at least 6-12 months after they feel better. If you are taking medicines as part of your treatment, do not stop taking medicines before you ask your health   care provider if it is safe to stop. You may need to have the medicine slowly decreased (tapered) over time to lower the risk of harmful side effects. Relationships Many people who have PTSD have difficulty trusting others. Make an effort to:  Take risks and develop trust with close friends and family members. Developing trust in others can help you feel safe and  connect you with emotional support.  Be open and honest about your feelings.  Try to have fun and relax in safe spaces, such as with friends and family.  Think about going to couples counseling, family education classes, or family therapy. Your loved ones may not always know how to be supportive. Therapy can be helpful for everyone.  How to recognize changes in your condition Be aware of your symptoms and how often you have them. The following symptoms mean that you need to seek help for your PTSD:  You feel suspicious and angry.  You have repeated flashbacks.  You avoid going out or being with others.  You have an increasing number of fights with close friends or family members, such as your spouse.  You have thoughts about hurting yourself or others.  You cannot get relief from feelings of depression or anxiety.  Where to find support Talking to others  Explain that PTSD is a mental health problem. It is something that a person can develop after experiencing or seeing a life-threatening event. Tell them that PTSD makes you feel stress like you did during the event.  Talk to your loved ones about the symptoms you have. Also tell them what things or situations can cause symptoms to start (are triggers for you).  Assure your loved ones that there are treatments to help PTSD. Discuss possibly seeking family therapy or couples therapy.  If you are worried or fearful about seeking treatment, ask for support. Finances Not all insurance plans cover mental health care, so it is important to check with your insurance carrier. If paying for co-pays or counseling services is a problem, search for a local or county mental health care center. Public mental health care services may be offered there at a low cost or no cost when you are not able to see a private health care provider. If you are a veteran, contact a local veterans organization or veterans hospital for more information. If you are  taking medicine for PTSD, you may be able to get the genericform, which may be less expensive than brand-name medicine. Some makers of prescription medicines also offer help to patients who cannot afford the medicines that they need. Community resources  Find a support group in your community. Often, groups are available for military veterans, trauma victims, and family members or caregivers.  Look into volunteer opportunities. Taking part in these can help you feel more connected to your community.  Contact a local organization to find out if you are eligible for a service dog.  Keep daily contact with at least one trusted friend or family member. Follow these instructions at home: Lifestyle  Exercise regularly. Try to do 30 or more minutes of physical activity on most days of the week.  Try to get 7-9 hours of sleep each night. To help with sleep: ? Keep your bedroom cool and dark. ? Do not eat a heavy meal during the hour before you go to bed. ? Do not drink alcohol or caffeinated drinks before bed. ? Avoid screen time before bedtime. This means avoiding use of your TV,   computer, tablet, and cell phone.  Avoid using alcohol or drugs.  Practice self-soothing skills and use them daily.  Try to have fun and seek humor in your life. General instructions  If your PTSD is affecting your marriage or family, seek help from a family therapist.  Take over-the-counter and prescription medicines only as told by your health care provider.  Make sure to let all of your health care providers know that you have PTSD. This is especially important if you are having surgery or need to be admitted to the hospital.  Keep all follow-up visits as told by your health care providers. This is important. Where to find more information: Go to this website to find more information about PTSD, treatment for PTSD, and how to get support:  National Center for PTSD: www.ptsd.va.gov  Contact a health care  provider if:  Your symptoms get worse or they do not get better. Get help right away if:  You have thoughts about hurting yourself or others. If you ever feel like you may hurt yourself or others, or have thoughts about taking your own life, get help right away. You can go to your nearest emergency department or call:  Your local emergency services (911 in the [place]  A suicide crisis helpline, such as the National Suicide Prevention Lifeline at 1-800-273-8255. This is open 24-hours a day.  Summary  If you are living with PTSD, there are ways to help you recover from it and manage your symptoms.  Find supportive environments and people who understand PTSD. Spend time in those places, and maintain contact with those people.  Work with your health care team to create a management plan that includes counseling, stress management techniques, and healthy lifestyle habits. This information is not intended to replace advice given to you by your health care provider. Make sure you discuss any questions you have with your health care provider. Document Released: 03/03/2017 Document Revised: 03/03/2017 Document Reviewed: 03/03/2017 Elsevier Interactive Patient Education  2018 [employer]   

## 2018-04-11 ENCOUNTER — Encounter: Payer: Self-pay | Admitting: Family Medicine

## 2018-04-25 ENCOUNTER — Other Ambulatory Visit: Payer: Self-pay

## 2018-04-25 ENCOUNTER — Encounter (HOSPITAL_COMMUNITY): Payer: Self-pay | Admitting: *Deleted

## 2018-04-25 ENCOUNTER — Emergency Department (HOSPITAL_COMMUNITY)
Admission: EM | Admit: 2018-04-25 | Discharge: 2018-04-26 | Disposition: A | Discharge status: Home / Self Care | Payer: Federal, State, Local not specified - PPO | Attending: Emergency Medicine | Admitting: Emergency Medicine

## 2018-04-25 ENCOUNTER — Emergency Department (HOSPITAL_COMMUNITY): Payer: Federal, State, Local not specified - PPO

## 2018-04-25 DIAGNOSIS — R072 Precordial pain: Secondary | ICD-10-CM | POA: Diagnosis not present

## 2018-04-25 DIAGNOSIS — Z79899 Other long term (current) drug therapy: Secondary | ICD-10-CM | POA: Diagnosis not present

## 2018-04-25 DIAGNOSIS — R079 Chest pain, unspecified: Secondary | ICD-10-CM | POA: Diagnosis present

## 2018-04-25 LAB — CBC
HEMATOCRIT: 38.8 % — AB (ref 39.0–52.0)
HEMOGLOBIN: 12.6 g/dL — AB (ref 13.0–17.0)
MCH: 30.6 pg (ref 26.0–34.0)
MCHC: 32.5 g/dL (ref 30.0–36.0)
MCV: 94.2 fL (ref 78.0–100.0)
Platelets: 185 10*3/uL (ref 150–400)
RBC: 4.12 MIL/uL — AB (ref 4.22–5.81)
RDW: 12.3 % (ref 11.5–15.5)
WBC: 4.9 10*3/uL (ref 4.0–10.5)

## 2018-04-25 LAB — BASIC METABOLIC PANEL
ANION GAP: 7 (ref 5–15)
BUN: 18 mg/dL (ref 6–20)
CALCIUM: 9.6 mg/dL (ref 8.9–10.3)
CHLORIDE: 106 mmol/L (ref 101–111)
CO2: 27 mmol/L (ref 22–32)
Creatinine, Ser: 1.43 mg/dL — ABNORMAL HIGH (ref 0.61–1.24)
GFR calc non Af Amer: 49 mL/min — ABNORMAL LOW (ref >60)
GFR, EST AFRICAN AMERICAN: 57 mL/min — AB (ref >60)
Glucose, Bld: 109 mg/dL — ABNORMAL HIGH (ref 65–99)
POTASSIUM: 4 mmol/L (ref 3.5–5.1)
Sodium: 140 mmol/L (ref 135–145)

## 2018-04-25 NOTE — ED Triage Notes (Signed)
Pt c/o central chest pain that started earlier this evening after eating; pt states the pain does not radiate and he feels a little "disoriented"

## 2018-04-26 LAB — I-STAT TROPONIN, ED
TROPONIN I, POC: 0.01 ng/mL (ref 0.00–0.08)
Troponin i, poc: 0.01 ng/mL (ref 0.00–0.08)

## 2018-04-26 MED ORDER — GI COCKTAIL ~~LOC~~
30.0000 mL | Freq: Once | ORAL | Status: AC
  Administered 2018-04-26: 30 mL via ORAL
  Filled 2018-04-26: qty 30

## 2018-04-26 NOTE — ED Provider Notes (Signed)
[facility] EMERGENCY DEPARTMENT Provider Note   CSN: [id number] Arrival date & time: 04/25/18  2308     History   Chief Complaint Chief Complaint  Patient presents with  . Chest Pain   Pt seen with NP student, I performed history/physical/documentation  HPI Nicholas Huang is a 67 y.o. male.  The history is provided by the patient.  Chest Pain   This is a new problem. The current episode started 3 to 5 hours ago. The problem occurs constantly. The problem has been gradually improving. Associated with: 1 hour after eating heavy meal. The pain is present in the substernal region. The pain is moderate. The quality of the pain is described as pressure-like. The pain does not radiate. Pertinent negatives include no abdominal pain, no diaphoresis, no fever, no shortness of breath, no syncope and no vomiting. He has tried nothing for the symptoms. Risk factors include being elderly.  Pertinent negatives for past medical history include no CAD, no DVT, no PE and no strokes.  reports earlier tonight after eating a heavy meal he began having chest pain.  He denies any other significant symptoms.  He is now feeling improved.  Past Medical History:  Diagnosis Date  . Anxiety   . Cataract   . GERD (gastroesophageal reflux disease)   . Herpes labialis   . Overactive bladder    followed by urology/[facility] yearly    Patient Active Problem List   Diagnosis Date Noted  . Class 1 obesity due to excess calories without serious comorbidity with body mass index (BMI) of 33.0 to 33.9 in adult 02/09/2017  . BPH (benign prostatic hyperplasia) 02/17/2016  . Overactive bladder 02/10/2016  . Anxiety and depression 10/22/2015  . PTSD (post-traumatic stress disorder) 10/22/2015  . Insomnia 10/22/2015    Past Surgical History:  Procedure Laterality Date  . REFRACTIVE SURGERY          Home Medications    Prior to Admission medications   Medication Sig Start Date End Date Taking? Authorizing  Provider  sertraline (ZOLOFT) 100 MG tablet Take 2 tablets (200 mg total) by mouth at bedtime. 11/09/17   Ethelda Chick, MD  tamsulosin (FLOMAX) 0.4 MG CAPS capsule Take 0.4 mg by mouth daily.    [provider]    Family History Family History  Problem Relation Age of Onset  . Hypertension Mother   . Diabetes Mother   . Alcohol abuse Father     Social History Social History   Tobacco Use  . Smoking status: Never Smoker  . Smokeless tobacco: Never Used  Substance Use Topics  . Alcohol use: No  . Drug use: No     Allergies   Patient has no known allergies.   Review of Systems Review of Systems  Constitutional: Negative for diaphoresis and fever.  Respiratory: Negative for shortness of breath.   Cardiovascular: Positive for chest pain. Negative for syncope.  Gastrointestinal: Negative for abdominal pain and vomiting.  All other systems reviewed and are negative.    Physical Exam Updated Vital Signs BP (!) 159/104   Pulse (!) 53   Temp 97.9 F (36.6 C) (Oral)   Resp 17   Ht 1.791 m (5' 10.5")   Wt 104.3 kg (230 lb)   SpO2 98%   BMI 32.54 kg/m   Physical Exam CONSTITUTIONAL: Well developed/well nourished HEAD: Normocephalic/atraumatic EYES: EOMI ENMT: Mucous membranes moist NECK: supple no meningeal signs SPINE/BACK:entire spine nontender CV: S1/S2 noted, no murmurs/rubs/gallops noted LUNGS: Lungs are  clear to auscultation bilaterally, no apparent distress ABDOMEN: soft, nontender, no rebound or guarding, bowel sounds noted throughout abdomen GU:no cva tenderness NEURO: Pt is awake/alert/appropriate, moves all extremitiesx4.  No facial droop.   EXTREMITIES: pulses normal/equalx4, full ROM, no calf tenderness or edema SKIN: warm, color normal PSYCH: no abnormalities of mood noted, alert and oriented to situation   ED Treatments / Results  Labs (all labs ordered are listed, but only abnormal results are displayed) Labs Reviewed  BASIC  METABOLIC PANEL - Abnormal; Notable for the following components:      Result Value   Glucose, Bld 109 (*)    Creatinine, Ser 1.43 (*)    GFR calc non Af Amer 49 (*)    GFR calc Af Amer 57 (*)    All other components within normal limits  CBC - Abnormal; Notable for the following components:   RBC 4.12 (*)    Hemoglobin 12.6 (*)    HCT 38.8 (*)    All other components within normal limits  I-STAT TROPONIN, ED  I-STAT TROPONIN, ED    EKG EKG Interpretation  Date/Time:  Tuesday April 25 2018 23:21:45 EDT Ventricular Rate:  61 PR Interval:    QRS Duration: 88 QT Interval:  435 QTC Calculation: 439 R Axis:   4 Text Interpretation:  Sinus rhythm Prolonged PR interval Left ventricular hypertrophy No previous ECGs available Confirmed by Zadie RhineWickline, Jomar Denz ([id number]) on 04/25/2018 11:31:08 PM   Radiology Dg Chest 2 View  Result Date: 04/25/2018 CLINICAL DATA:  Chest pain. EXAM: CHEST - 2 VIEW COMPARISON:  None. FINDINGS: Mild cardiomegaly. The hila and mediastinum are unremarkable. No pneumothorax. No nodules or masses. No focal infiltrates. IMPRESSION: No active cardiopulmonary disease. Electronically Signed   By: Gerome Samavid  Williams III M.D   On: 04/25/2018 23:51    Procedures Procedures (including critical care time)  Medications Ordered in ED Medications  gi cocktail (Maalox,Lidocaine,Donnatal) (30 mLs Oral Given 04/26/18 0052)     Initial Impression / Assessment and Plan / ED Course  I have reviewed the triage vital signs and the nursing notes.  Pertinent labs & imaging results that were available during my care of the patient were reviewed by me and considered in my medical decision making (see chart for details).     1:28 AM Heart score is 3.  Patient is well-appearing.  He thinks this is all due to eating a heavy meal just prior to going to sleep I offered monitoring for 3 hours to get an additional troponin, but he did not want to wait that long, so an earlier troponin was  ordered and is negative  No signs of PE or dissection at this time.  Patient appears very comfortable  Patient improved.  He is resting comfortably. He is convinced it is related to his large meal earlier.  He has had 2 negative  troponins, heart score 3 I feel he is appropriate for discharge.  We discussed strict ER return precautions Final Clinical Impressions(s) / ED Diagnoses   Final diagnoses:  Precordial pain    ED Discharge Orders    None       Zadie RhineWickline, Ryna Beckstrom, MD 04/26/18 [phone]

## 2018-04-26 NOTE — Discharge Instructions (Addendum)

## 2018-05-10 ENCOUNTER — Ambulatory Visit: Payer: Federal, State, Local not specified - PPO | Admitting: Family Medicine

## 2018-06-07 ENCOUNTER — Ambulatory Visit (INDEPENDENT_AMBULATORY_CARE_PROVIDER_SITE_OTHER): Payer: Medicare PPO | Admitting: Family Medicine

## 2018-06-07 ENCOUNTER — Encounter: Payer: Self-pay | Admitting: Family Medicine

## 2018-06-07 ENCOUNTER — Other Ambulatory Visit: Payer: Self-pay

## 2018-06-07 VITALS — BP 130/72 | HR 64 | Temp 98.0°F | Resp 16 | Ht 72.05 in | Wt 234.0 lb

## 2018-06-07 DIAGNOSIS — F4310 Post-traumatic stress disorder, unspecified: Secondary | ICD-10-CM | POA: Diagnosis not present

## 2018-06-07 DIAGNOSIS — N401 Enlarged prostate with lower urinary tract symptoms: Secondary | ICD-10-CM | POA: Diagnosis not present

## 2018-06-07 DIAGNOSIS — F419 Anxiety disorder, unspecified: Secondary | ICD-10-CM | POA: Diagnosis not present

## 2018-06-07 DIAGNOSIS — F5104 Psychophysiologic insomnia: Secondary | ICD-10-CM

## 2018-06-07 DIAGNOSIS — F329 Major depressive disorder, single episode, unspecified: Secondary | ICD-10-CM

## 2018-06-07 DIAGNOSIS — R351 Nocturia: Secondary | ICD-10-CM

## 2018-06-07 MED ORDER — SERTRALINE HCL 100 MG PO TABS: 100.0000 mg | ORAL_TABLET | Freq: Every day | ORAL | 1 refills | Status: AC

## 2018-06-07 NOTE — Progress Notes (Signed)
Subjective:    Patient ID: Nicholas Huang, male    DOB: 04-Nov-1951, 67 y.o.   MRN: [id number]  06/07/2018  Depression (6 month follow-up ) and Anxiety    HPI This 67 y.o. male presents for evaluation of anxiety/depression.  Management changes made at last visit include the following: Stable anxiety and depression with improvement in the past 6 months.  Mother passed away 1 year ago and doing better at this time.  Continue sertraline 200 mg daily.  Continue with psychotherapy and encourage increase in therapy to once weekly to once every other week and frequency.  Continue regular exercise.  Recommend exercise 5-6 days/week. Chronic posttraumatic stress disorder due to timing arm services.  Followed by [facility] system on a yearly basis.  I am happy to write a letter to [facility] provider indicating the recent increase in sertraline therapy occurred following the death of patient's mother. Insomnia: Improved.  No recent stressors.  No longer warranting sleep aid.  UPDATE: Highs and lows; counselor moved so has not seen him in two months. Couldn't hook back up with him. Stress lately; son's mother; friendly.  Eight grandchildren together.  Two children together.  Together through the years.  Not hospitalized; discharged last week.  Does not take care of her.  Separate lives.  Still dating same person.  Not living together.    Sleeping a little better.    Has not seen lost sister yet.  Has never communicated with sister; reached out once to sister; she doesn't believe it.  Her father stated not true.  She has family features.    Having tremors/shakes/jerks; last one second.  Whole body tremor.  Started for past six months; happens 4-5 times per day.  Researched and feels due to sertraline.  Played golf today.  Playing golf 2-3 days; went to [facility].  Plays with different people every day.  Riding golf cart.    Going to gym 3 times per week.  Enjoying activities.  Has been good for  two weeks. Had lows when mother passed; mother passed away two summers ago.   No SI or HI.  Low or bad days 2-3 per month; stays in bed all day or stay sad.  Does not want to be bothered; does not want to be around anyone.  Will cancel out on people.  Tries to keep moving.    Last discussion with therapist.  Discussed relationship issues.  Last visit May 2019.  Has number to therapy.    [facility]  [facility]  Goes every six months.    BPH: Flomax daily; nocturia x 2-4.   BP Readings from Last 3 Encounters:  06/07/18 130/72  04/26/18 (!) 170/97  01/04/18 130/82   Wt Readings from Last 3 Encounters:  06/07/18 234 lb (106.1 kg)  04/25/18 230 lb (104.3 kg)  01/04/18 233 lb (105.7 kg)   Immunization History  Administered Date(s) Administered  . Influenza, High Dose Seasonal PF 08/15/2017  . Influenza-Unspecified 08/16/2015, 08/15/2016, 08/15/2017  . Pneumococcal Conjugate-13 06/24/2017  . Zoster 11/15/2013    Review of Systems  Constitutional: Negative for activity change, appetite change, chills, diaphoresis, fatigue, fever and unexpected weight change.  HENT: Negative for congestion, dental problem, drooling, ear discharge, ear pain, facial swelling, hearing loss, mouth sores, nosebleeds, postnasal drip, rhinorrhea, sinus pressure, sneezing, sore throat, tinnitus, trouble swallowing and voice change.   Eyes: Negative for photophobia, pain, discharge, redness, itching and visual disturbance.  Respiratory:  Negative for apnea, cough, choking, chest tightness, shortness of breath, wheezing and stridor.   Cardiovascular: Negative for chest pain, palpitations and leg swelling.  Gastrointestinal: Negative for abdominal pain, blood in stool, constipation, diarrhea, nausea and vomiting.  Endocrine: Negative for cold intolerance, heat intolerance, polydipsia, polyphagia and polyuria.  Genitourinary: Negative for decreased urine volume, difficulty urinating,  discharge, dysuria, enuresis, flank pain, frequency, genital sores, hematuria, penile pain, penile swelling, scrotal swelling, testicular pain and urgency.  Musculoskeletal: Negative for arthralgias, back pain, gait problem, joint swelling, myalgias, neck pain and neck stiffness.  Skin: Negative for color change, pallor, rash and wound.  Allergic/Immunologic: Negative for environmental allergies, food allergies and immunocompromised state.  Neurological: Positive for tremors. Negative for dizziness, seizures, syncope, facial asymmetry, speech difficulty, weakness, light-headedness, numbness and headaches.  Hematological: Negative for adenopathy. Does not bruise/bleed easily.  Psychiatric/Behavioral: Positive for dysphoric mood and sleep disturbance. Negative for agitation, behavioral problems, confusion, decreased concentration, hallucinations, self-injury and suicidal ideas. The patient is nervous/anxious. The patient is not hyperactive.     Past Medical History:  Diagnosis Date  . Anxiety   . Cataract   . GERD (gastroesophageal reflux disease)   . Herpes labialis   . Overactive bladder    followed by urology/[facility] yearly   Past Surgical History:  Procedure Laterality Date  . REFRACTIVE SURGERY     No Known Allergies Current Outpatient Medications on File Prior to Visit  Medication Sig Dispense Refill  . tamsulosin (FLOMAX) 0.4 MG CAPS capsule Take 0.4 mg by mouth daily.     No current facility-administered medications on file prior to visit.    Social History   Socioeconomic History  . Marital status: Divorced    Spouse name: Not on file  . Number of children: 2  . Years of education: Not on file  . Highest education level: Not on file  Occupational History  . Occupation: retired    Associate Professor: [employer]    Comment: Medical illustrator.  Social Needs  . Financial resource strain: Not on file  . Food insecurity:    Worry: Not on file    Inability: Not on file  .  Transportation needs:    Medical: Not on file    Non-medical: Not on file  Tobacco Use  . Smoking status: Never Smoker  . Smokeless tobacco: Never Used  Substance and Sexual Activity  . Alcohol use: No  . Drug use: No  . Sexual activity: Yes  Lifestyle  . Physical activity:    Days per week: Not on file    Minutes per session: Not on file  . Stress: Not on file  Relationships  . Social connections:    Talks on phone: Not on file    Gets together: Not on file    Attends religious service: Not on file    Active member of club or organization: Not on file    Attends meetings of clubs or organizations: Not on file    Relationship status: Not on file  . Intimate partner violence:    Fear of current or ex partner: Not on file    Emotionally abused: Not on file    Physically abused: Not on file    Forced sexual activity: Not on file  Other Topics Concern  . Not on file  Social History Narrative   Marital status; divorced; dating in 2018     Children: 2 children; 7 grandchildren      Lives: alone  Employment: retired in 2015; retired Research officer, political partypostal service x 21 years      Tobacco: none      Alcohol: none      Drugs; none      Exercise: gym; weights; stepper; twice weekly.     Family History  Problem Relation Age of Onset  . Hypertension Mother   . Diabetes Mother   . Alcohol abuse Father        Objective:    BP 130/72   Pulse 64   Temp 98 F (36.7 C) (Oral)   Resp 16   Ht 6' 0.05" (1.83 m)   Wt 234 lb (106.1 kg)   SpO2 99%   BMI 31.69 kg/m  Physical Exam  Constitutional: He is oriented to person, place, and time. He appears well-developed and well-nourished. No distress.  HENT:  Head: Normocephalic and atraumatic.  Right Ear: External ear normal.  Left Ear: External ear normal.  Nose: Nose normal.  Mouth/Throat: Oropharynx is clear and moist.  Eyes: Pupils are equal, round, and reactive to light. Conjunctivae and EOM are normal.  Neck: Normal range of motion.  Neck supple. Carotid bruit is not present. No thyromegaly present.  Cardiovascular: Normal rate, regular rhythm, normal heart sounds and intact distal pulses. Exam reveals no gallop and no friction rub.  No murmur heard. Pulmonary/Chest: Effort normal and breath sounds normal. He has no wheezes. He has no rales.  Abdominal: Soft. Bowel sounds are normal. He exhibits no distension and no mass. There is no tenderness. There is no rebound and no guarding.  Musculoskeletal:       Right shoulder: Normal.       Left shoulder: Normal.       Cervical back: Normal.  Lymphadenopathy:    He has no cervical adenopathy.  Neurological: He is alert and oriented to person, place, and time. He has normal reflexes. He displays normal reflexes. No cranial nerve deficit or sensory deficit. He exhibits normal muscle tone. Coordination normal.  Skin: Skin is warm and dry. No rash noted. He is not diaphoretic.  Psychiatric: He has a normal mood and affect. His behavior is normal. Judgment and thought content normal.   No results found. Depression screen [facility]PHQ 2/9 06/07/2018 01/04/2018 11/09/2017 06/29/2017 02/09/2017  Decreased Interest 1 1 0 1 0  Down, Depressed, Hopeless 1 1 0 1 0  PHQ - 2 Score 2 2 0 2 0  Altered sleeping 1 1 - 1 -  Tired, decreased energy 0 1 - 1 -  Change in appetite 1 1 - - -  Feeling bad or failure about yourself  1 1 - 1 -  Trouble concentrating - 0 - 0 -  Moving slowly or fidgety/restless 0 0 - 0 -  Suicidal thoughts 0 0 - 0 -  PHQ-9 Score 5 6 - 5 -  Difficult doing work/chores - - - - -   Fall Risk  06/07/2018 01/04/2018 11/09/2017 06/29/2017 02/09/2017  Falls in the past year? No No No No No        Assessment & Plan:   1. Anxiety and depression   2. PTSD (post-traumatic stress disorder)   3. Psychophysiological insomnia   4. Benign prostatic hyperplasia with nocturia    Anxiety and depression with posttraumatic stress disorder in the veteran: Moderately controlled at this time.   Recommend decreasing sertraline to 1-1/2 tablets daily due to myoclonic jerking intermittently.  Continue psychotherapy regularly.  Continue daily exercise.  Counseling provided during office visit.  Sleeping well at this time.  [facility] [place] also [facility]ating and will return to clinic for ongoing management.  Does desire to be seen locally for acute issues.  Recommend patient establish with Dr. Creta Levin.  BPH: stable on Flomax; s[facility]p [facility].    No orders of the defined types were placed in this encounter.  Meds ordered this encounter  Medications  . sertraline (ZOLOFT) 100 MG tablet    Sig: Take 1-2 tablets (100-200 mg total) by mouth at bedtime.    Dispense:  180 tablet    Refill:  1    Return in about 6 months (around 12/08/2018) for follow-up chronic medical conditions [facility]OE STALLINGS.   Dekisha Mesmer Paulita Fujita, M.D. Primary Care at [facility] previously [facility] [address] [place], [place]  [address] [phone] phone [phone] fax

## 2018-06-07 NOTE — Patient Instructions (Signed)
     IF you received an x-ray today, you will receive an invoice from [facility]. Please contact [facility] at [phone] with questions or concerns regarding your invoice.   IF you received labwork today, you will receive an invoice from [facility]. Please contact [facility] at [phone] with questions or concerns regarding your invoice.   Our billing staff will not be able to assist you with questions regarding bills from these companies.  You will be contacted with the lab results as soon as they are available. The fastest way to get your results is to activate your My Chart account. Instructions are located on the last page of this paperwork. If you have not heard from us regarding the results in 2 weeks, please contact this office.     

## 2018-06-18 DIAGNOSIS — R8290 Unspecified abnormal findings in urine: Secondary | ICD-10-CM | POA: Diagnosis not present

## 2018-06-18 DIAGNOSIS — R5383 Other fatigue: Secondary | ICD-10-CM | POA: Diagnosis not present

## 2018-06-18 DIAGNOSIS — R0789 Other chest pain: Secondary | ICD-10-CM | POA: Diagnosis not present

## 2018-06-22 DIAGNOSIS — N3000 Acute cystitis without hematuria: Secondary | ICD-10-CM | POA: Diagnosis not present

## 2018-08-10 ENCOUNTER — Emergency Department
Admission: EM | Admit: 2018-08-10 | Discharge: 2018-08-10 | Disposition: A | Discharge status: Home / Self Care | Payer: Medicare PPO | Attending: Emergency Medicine | Admitting: Emergency Medicine

## 2018-08-10 ENCOUNTER — Other Ambulatory Visit: Payer: Self-pay

## 2018-08-10 ENCOUNTER — Emergency Department: Payer: Medicare PPO

## 2018-08-10 DIAGNOSIS — R911 Solitary pulmonary nodule: Secondary | ICD-10-CM | POA: Diagnosis not present

## 2018-08-10 DIAGNOSIS — N50812 Left testicular pain: Secondary | ICD-10-CM | POA: Diagnosis present

## 2018-08-10 DIAGNOSIS — N289 Disorder of kidney and ureter, unspecified: Secondary | ICD-10-CM

## 2018-08-10 DIAGNOSIS — N453 Epididymo-orchitis: Secondary | ICD-10-CM | POA: Diagnosis not present

## 2018-08-10 DIAGNOSIS — N5089 Other specified disorders of the male genital organs: Secondary | ICD-10-CM

## 2018-08-10 LAB — CBC WITH DIFFERENTIAL/PLATELET
BASOS PCT: 0 %
Basophils Absolute: 0 10*3/uL (ref 0–0.1)
Eosinophils Absolute: 0 10*3/uL (ref 0–0.7)
Eosinophils Relative: 0 %
HEMATOCRIT: 35 % — AB (ref 40.0–52.0)
HEMOGLOBIN: 11.7 g/dL — AB (ref 13.0–18.0)
LYMPHS ABS: 1.1 10*3/uL (ref 1.0–3.6)
Lymphocytes Relative: 10 %
MCH: 31.5 pg (ref 26.0–34.0)
MCHC: 33.5 g/dL (ref 32.0–36.0)
MCV: 93.9 fL (ref 80.0–100.0)
MONOS PCT: 5 %
Monocytes Absolute: 0.6 10*3/uL (ref 0.2–1.0)
NEUTROS ABS: 9.6 10*3/uL — AB (ref 1.4–6.5)
NEUTROS PCT: 85 %
Platelets: 202 10*3/uL (ref 150–440)
RBC: 3.73 MIL/uL — ABNORMAL LOW (ref 4.40–5.90)
RDW: 12.6 % (ref 11.5–14.5)
WBC: 11.4 10*3/uL — AB (ref 3.8–10.6)

## 2018-08-10 LAB — COMPREHENSIVE METABOLIC PANEL
ALBUMIN: 4.2 g/dL (ref 3.5–5.0)
ALK PHOS: 39 U/L (ref 38–126)
ALT: 24 U/L (ref 0–44)
ANION GAP: 9 (ref 5–15)
AST: 29 U/L (ref 15–41)
BILIRUBIN TOTAL: 0.7 mg/dL (ref 0.3–1.2)
BUN: 21 mg/dL (ref 8–23)
CALCIUM: 9.3 mg/dL (ref 8.9–10.3)
CO2: 25 mmol/L (ref 22–32)
CREATININE: 2.05 mg/dL — AB (ref 0.61–1.24)
Chloride: 107 mmol/L (ref 98–111)
GFR calc Af Amer: 37 mL/min — ABNORMAL LOW (ref >60)
GFR calc non Af Amer: 32 mL/min — ABNORMAL LOW (ref >60)
GLUCOSE: 203 mg/dL — AB (ref 70–99)
Potassium: 4 mmol/L (ref 3.5–5.1)
Sodium: 141 mmol/L (ref 135–145)
TOTAL PROTEIN: 7.3 g/dL (ref 6.5–8.1)

## 2018-08-10 LAB — LIPASE, BLOOD: Lipase: 42 U/L (ref 11–51)

## 2018-08-10 MED ORDER — DOXYCYCLINE HYCLATE 100 MG PO CAPS: 100.0000 mg | ORAL_CAPSULE | Freq: Two times a day (BID) | ORAL | 0 refills | Status: AC

## 2018-08-10 MED ORDER — KETOROLAC TROMETHAMINE 30 MG/ML IJ SOLN
15.0000 mg | Freq: Once | INTRAMUSCULAR | Status: AC
  Administered 2018-08-10: 15 mg via INTRAVENOUS
  Filled 2018-08-10: qty 1

## 2018-08-10 MED ORDER — OXYCODONE-ACETAMINOPHEN 5-325 MG PO TABS
2.0000 | ORAL_TABLET | Freq: Once | ORAL | Status: DC
Start: 2018-08-10 — End: 2018-08-10
  Filled 2018-08-10: qty 2

## 2018-08-10 MED ORDER — AZITHROMYCIN 500 MG PO TABS
1000.0000 mg | ORAL_TABLET | Freq: Once | ORAL | Status: AC
  Administered 2018-08-10: 1000 mg via ORAL
  Filled 2018-08-10: qty 2

## 2018-08-10 MED ORDER — MORPHINE SULFATE (PF) 4 MG/ML IV SOLN
4.0000 mg | Freq: Once | INTRAVENOUS | Status: AC
  Administered 2018-08-10: 4 mg via INTRAVENOUS
  Filled 2018-08-10: qty 1

## 2018-08-10 MED ORDER — OXYCODONE-ACETAMINOPHEN 5-325 MG PO TABS: 1.0000 | ORAL_TABLET | Freq: Three times a day (TID) | ORAL | 0 refills | Status: AC | PRN

## 2018-08-10 MED ORDER — CEFTRIAXONE SODIUM 250 MG IJ SOLR
250.0000 mg | INTRAMUSCULAR | Status: DC
  Administered 2018-08-10: 250 mg via INTRAMUSCULAR
  Filled 2018-08-10: qty 250

## 2018-08-10 NOTE — ED Notes (Signed)
States pain comes in intermittent episodes.

## 2018-08-10 NOTE — ED Notes (Signed)
Patient transported to Ultrasound 

## 2018-08-10 NOTE — ED Notes (Signed)
Pt pain 7/10-pt back from CT. Pt states that he does not want any pain medication.

## 2018-08-10 NOTE — ED Provider Notes (Signed)
[facility] Emergency Department Provider Note       Time seen: ----------------------------------------- 12:30 PM on 08/10/2018 -----------------------------------------   I have reviewed the triage vital signs and the nursing notes.  HISTORY   Chief Complaint Testicle Pain    HPI Nicholas Huang is a 67 y.o. male with a history of anxiety, GERD, genital herpes, PTSD who presents to the ED for left-sided testicular pain that started when he woke up this morning.  Patient states the pain is been gradually worsening over the day.  Pain is radiating to his left groin and hip region.  He has had a history of kidney stones.  He denies fevers, chills or other complaints.  Pain is sharp.  Past Medical History:  Diagnosis Date  . Anxiety   . Cataract   . GERD (gastroesophageal reflux disease)   . Herpes labialis   . Overactive bladder    followed by urology/[facility] yearly    Patient Active Problem List   Diagnosis Date Noted  . Class 1 obesity due to excess calories without serious comorbidity with body mass index (BMI) of 33.0 to 33.9 in adult 02/09/2017  . BPH (benign prostatic hyperplasia) 02/17/2016  . Overactive bladder 02/10/2016  . Anxiety and depression 10/22/2015  . PTSD (post-traumatic stress disorder) 10/22/2015  . Insomnia 10/22/2015    Past Surgical History:  Procedure Laterality Date  . REFRACTIVE SURGERY      Allergies Patient has no known allergies.  Social History Social History   Tobacco Use  . Smoking status: Never Smoker  . Smokeless tobacco: Never Used  Substance Use Topics  . Alcohol use: No  . Drug use: No   Review of Systems Constitutional: Negative for fever. Cardiovascular: Negative for chest pain. Respiratory: Negative for shortness of breath. Gastrointestinal: Positive for flank pain Genitourinary: Positive for testicular pain, left-sided groin pain Musculoskeletal: Negative for back pain. Skin: Negative for  rash. Neurological: Negative for headaches, focal weakness or numbness.  All systems negative/normal/unremarkable except as stated in the HPI  ____________________________________________   PHYSICAL EXAM:  VITAL SIGNS: ED Triage Vitals  Enc Vitals Group     BP      Pulse      Resp      Temp      Temp src      SpO2      Weight      Height      Head Circumference      Peak Flow      Pain Score      Pain Loc      Pain Edu?      Excl. in GC?    Constitutional: Alert and oriented.  Mild distress Eyes: Conjunctivae are normal. Normal extraocular movements. ENT   Head: Normocephalic and atraumatic.   Nose: No congestion/rhinnorhea.   Mouth/Throat: Mucous membranes are moist.   Neck: No stridor. Cardiovascular: Normal rate, regular rhythm. No murmurs, rubs, or gallops. Respiratory: Normal respiratory effort without tachypnea nor retractions. Breath sounds are clear and equal bilaterally. No wheezes/rales/rhonchi. Gastrointestinal: Left flank tenderness, no rebound or guarding.  Normal bowel sounds. Genitourinary: Musculoskeletal: Nontender with normal range of motion in extremities. No lower extremity tenderness nor edema. Neurologic:  Normal speech and language. No gross focal neurologic deficits are appreciated.  Skin:  Skin is warm, dry and intact. No rash noted. Psychiatric: Mood and affect are normal. Speech and behavior are normal.  ___________________________________________  ED COURSE:  As part of my medical decision making,  I reviewed the following data within the electronic MEDICAL RECORD NUMBER History obtained from family if available, nursing notes, old chart and ekg, as well as notes from prior ED visits. Patient presented for left flank and groin pain, we will assess with labs and imaging as indicated at this time.   Procedures ____________________________________________   LABS (pertinent positives/negatives)  Labs Reviewed  CBC WITH  DIFFERENTIAL/PLATELET - Abnormal; Notable for the following components:      Result Value   WBC 11.4 (*)    RBC 3.73 (*)    Hemoglobin 11.7 (*)    HCT 35.0 (*)    Neutro Abs 9.6 (*)    All other components within normal limits  COMPREHENSIVE METABOLIC PANEL - Abnormal; Notable for the following components:   Glucose, Bld 203 (*)    Creatinine, Ser 2.05 (*)    GFR calc non Af Amer 32 (*)    GFR calc Af Amer 37 (*)    All other components within normal limits  LIPASE, BLOOD  URINALYSIS, COMPLETE (UACMP) WITH MICROSCOPIC    RADIOLOGY Images were viewed by me  CT renal protocol IMPRESSION: 1. Findings compatible with left epididymitis. 2. Moderate-sized bilateral mildly complicated hydroceles. IMPRESSION: No acute abnormality identified in the abdomen and pelvis. No nephrolithiasis or hydronephrosis bilaterally.  Bilateral hydroceles  1.6 cm nodule in the left lower lobe. Further evaluation with a chest CT on outpatient basis is recommended. ____________________________________________  DIFFERENTIAL DIAGNOSIS   Renal colic, UTI, pyelonephritis, epididymal orchitis, hernia  FINAL ASSESSMENT AND PLAN  Epididymal orchitis, mild renal insufficiency, pulmonary nodule   Plan: The patient had presented for flank and testicle pain. Patient's labs did reveal increased creatinine compared to prior and I have advised that he will need to have this rechecked in the next week and increase his fluid intake. Patient's imaging was concerning only for epididymitis and likely epididymal orchitis.  No renal colic was identified.  We did give him Rocephin and Zithromax because he admits to unprotected sex.  He will go home on doxycycline and Percocet.  Advise close outpatient follow-up with his doctor.   Ulice Dash, MD   Note: This note was generated in part or whole with voice recognition software. Voice recognition is usually quite accurate but there are transcription  errors that can and very often do occur. I apologize for any typographical errors that were not detected and corrected.     Emily Filbert, MD 08/10/18 [phone]

## 2018-08-10 NOTE — ED Notes (Signed)
Pt alert and oriented X4, active, cooperative, pt in NAD. RR even and unlabored, color WNL.  Pt informed to return if any life threatening symptoms occur.  Discharge and followup instructions reviewed.  

## 2018-08-10 NOTE — ED Triage Notes (Signed)
C/o left testicle pain that he awoke with this AM. Progressively worsening over the day. Pain to radiating to left groin. VSS with EMS

## 2018-11-27 DIAGNOSIS — Z86010 Personal history of colonic polyps: Secondary | ICD-10-CM | POA: Diagnosis not present

## 2018-11-27 DIAGNOSIS — R1310 Dysphagia, unspecified: Secondary | ICD-10-CM | POA: Diagnosis not present

## 2018-12-11 ENCOUNTER — Ambulatory Visit: Payer: Federal, State, Local not specified - PPO | Admitting: Family Medicine

## 2018-12-29 ENCOUNTER — Ambulatory Visit (INDEPENDENT_AMBULATORY_CARE_PROVIDER_SITE_OTHER): Payer: Medicare PPO | Admitting: Emergency Medicine

## 2018-12-29 ENCOUNTER — Other Ambulatory Visit: Payer: Self-pay

## 2018-12-29 ENCOUNTER — Encounter: Payer: Self-pay | Admitting: Emergency Medicine

## 2018-12-29 VITALS — BP 140/82 | HR 53 | Temp 98.3°F | Resp 16 | Ht 70.0 in | Wt 234.6 lb

## 2018-12-29 DIAGNOSIS — F329 Major depressive disorder, single episode, unspecified: Secondary | ICD-10-CM

## 2018-12-29 DIAGNOSIS — F4310 Post-traumatic stress disorder, unspecified: Secondary | ICD-10-CM

## 2018-12-29 DIAGNOSIS — F419 Anxiety disorder, unspecified: Secondary | ICD-10-CM

## 2018-12-29 NOTE — Patient Instructions (Addendum)
   If you have lab work done today you will be contacted with your lab results within the next 2 weeks.  If you have not heard from us then please contact us. The fastest way to get your results is to register for My Chart.   IF you received an x-ray today, you will receive an invoice from [facility]. Please contact [facility] at [phone] with questions or concerns regarding your invoice.   IF you received labwork today, you will receive an invoice from [facility]. Please contact [facility] at [phone] with questions or concerns regarding your invoice.   Our billing staff will not be able to assist you with questions regarding bills from these companies.  You will be contacted with the lab results as soon as they are available. The fastest way to get your results is to activate your My Chart account. Instructions are located on the last page of this paperwork. If you have not heard from us regarding the results in 2 weeks, please contact this office.     Living With Post-Traumatic Stress Disorder If you have been diagnosed with post-traumatic stress disorder (PTSD), you may be relieved that you now know why you have felt or behaved a certain way. Still, you may feel overwhelmed about the treatment ahead. You may also wonder how to get the support you need and how to deal with the condition day-to-day. If you are living with PTSD, there are ways to help you recover from it and manage your symptoms. How to manage lifestyle changes Managing stress Stress is your body's reaction to life changes and events, both good and bad. Stress can make PTSD worse. Take the following steps to cope with stress:  Talk with your health care provider or a counselor if you would like to learn more about techniques to reduce your stress. He or she may suggest some stress reduction techniques such as: ? Muscle relaxation exercises. ? Regular exercise. ? Meditation, yoga, or other  mind-body exercises. ? Breathing exercises. ? Listening to quiet music. ? Spending time outside.  Maintain a healthy lifestyle. Eat a healthy diet, exercise regularly, get plenty of sleep, and take time to relax.  Spend time with others. Talk with them about how you are feeling and what kind of support you need. Try to not isolate yourself, even though you may feel like doing that. Isolating yourself can delay your recovery.  Do activities and hobbies that you enjoy.  Pace yourself when doing stressful things. Take breaks, and reward yourself when you finish. Make sure that you do not overload your schedule.  Medicines Your health care provider may suggest certain medicines if he or she feels that they will help to improve your condition. Antidepressants or antipsychotic medicines may be used to treat PTSD. Avoid using alcohol and other substances that may prevent your medicines from working properly (may interact). It is also important to:  Talk with your pharmacist or health care provider about all medicines that you take, their possible side effects, and which medicines are safe to take together.  Make it your goal to take part in all treatment decisions (shared decision-making). Ask about possible side effects of medicines that your health care provider recommends, and tell him or her how you feel about having those side effects. It is best if shared decision-making with your health care provider is part of your total treatment plan. If your health care provider prescribes a medicine, you may not notice the full   benefits of it for 4-8 weeks. Most people who are treated for PTSD need to take medicine for at least 6-12 months after they feel better. If you are taking medicines as part of your treatment, do not stop taking medicines before you ask your health care provider if it is safe to stop. You may need to have the medicine slowly decreased (tapered) over time to lower the risk of harmful  side effects. Relationships Many people who have PTSD have difficulty trusting others. Make an effort to:  Take risks and develop trust with close friends and family members. Developing trust in others can help you feel safe and connect you with emotional support.  Be open and honest about your feelings.  Try to have fun and relax in safe spaces, such as with friends and family.  Think about going to couples counseling, family education classes, or family therapy. Your loved ones may not always know how to be supportive. Therapy can be helpful for everyone. How to recognize changes in your condition Be aware of your symptoms and how often you have them. The following symptoms mean that you need to seek help for your PTSD:  You feel suspicious and angry.  You have repeated flashbacks.  You avoid going out or being with others.  You have an increasing number of fights with close friends or family members, such as your spouse.  You have thoughts about hurting yourself or others.  You cannot get relief from feelings of depression or anxiety. Where to find support Talking to others  Explain that PTSD is a mental health problem. It is something that a person can develop after experiencing or seeing a life-threatening event. Tell them that PTSD makes you feel stress like you did during the event.  Talk to your loved ones about the symptoms you have. Also tell them what things or situations can cause symptoms to start (are triggers for you).  Assure your loved ones that there are treatments to help PTSD. Discuss possibly seeking family therapy or couples therapy.  If you are worried or fearful about seeking treatment, ask for support. Finances Not all insurance plans cover mental health care, so it is important to check with your insurance carrier. If paying for co-pays or counseling services is a problem, search for a local or county mental health care center. Public mental health care  services may be offered there at a low cost or no cost when you are not able to see a private health care provider. If you are a veteran, contact a local veterans organization or veterans hospital for more information. If you are taking medicine for PTSD, you may be able to get the genericform, which may be less expensive than brand-name medicine. Some makers of prescription medicines also offer help to patients who cannot afford the medicines that they need. Community resources  Find a support group in your community. Often, groups are available for military veterans, trauma victims, and family members or caregivers.  Look into volunteer opportunities. Taking part in these can help you feel more connected to your community.  Contact a local organization to find out if you are eligible for a service dog.  Keep daily contact with at least one trusted friend or family member. Follow these instructions at home: Lifestyle  Exercise regularly. Try to do 30 or more minutes of physical activity on most days of the week.  Try to get 7-9 hours of sleep each night. To help with sleep: ?   Keep your bedroom cool and dark. ? Do not eat a heavy meal during the hour before you go to bed. ? Do not drink alcohol or caffeinated drinks before bed. ? Avoid screen time before bedtime. This means avoiding use of your TV, computer, tablet, and cell phone.  Avoid using alcohol or drugs.  Practice self-soothing skills and use them daily.  Try to have fun and seek humor in your life. General instructions  If your PTSD is affecting your marriage or family, seek help from a family therapist.  Take over-the-counter and prescription medicines only as told by your health care provider.  Make sure to let all of your health care providers know that you have PTSD. This is especially important if you are having surgery or need to be admitted to the hospital.  Keep all follow-up visits as told by your health care  providers. This is important. Where to find more information Go to this website to find more information about PTSD, treatment for PTSD, and how to get support:  National Center for PTSD: www.ptsd.va.gov Contact a health care provider if:  Your symptoms get worse or they do not get better. Get help right away if:  You have thoughts about hurting yourself or others. If you ever feel like you may hurt yourself or others, or have thoughts about taking your own life, get help right away. You can go to your nearest emergency department or call:  Your local emergency services (911 in the [place]  A suicide crisis helpline, such as the National Suicide Prevention Lifeline at 1-800-273-8255. This is open 24-hours a day. Summary  If you are living with PTSD, there are ways to help you recover from it and manage your symptoms.  Find supportive environments and people who understand PTSD. Spend time in those places, and maintain contact with those people.  Work with your health care team to create a management plan that includes counseling, stress management techniques, and healthy lifestyle habits. This information is not intended to replace advice given to you by your health care provider. Make sure you discuss any questions you have with your health care provider. Document Released: 03/03/2017 Document Revised: 03/03/2017 Document Reviewed: 03/03/2017 [employer] Interactive Patient Education  2019 [employer]  

## 2018-12-29 NOTE — Progress Notes (Signed)
Nicholas Huang 68 y.o.   Chief Complaint  Patient presents with  . Anxiety    6 mon f/u for anxiety and  Last seen by Nilda Simmer on 06/07/18    HISTORY OF PRESENT ILLNESS: This is a 68 y.o. male with history of depression/anxiety, on sertraline, with seen Dr. Katrinka Blazing here, and also goes to the [facility] outpatient services, has been seeing a psychiatrist but not since last June due to insurance issues.  Needs a referral to [facility] [place]. Doing well.  Has no complaints or medical concerns today.  HPI   Prior to Admission medications   Medication Sig Start Date End Date Taking? Authorizing Provider  doxycycline (VIBRAMYCIN) 100 MG capsule Take 1 capsule (100 mg total) by mouth 2 (two) times daily. 08/10/18   Emily Filbert, MD  oxyCODONE-acetaminophen (PERCOCET) 5-325 MG tablet Take 1 tablet by mouth every 8 (eight) hours as needed. 08/10/18   Emily Filbert, MD  sertraline (ZOLOFT) 100 MG tablet Take 1-2 tablets (100-200 mg total) by mouth at bedtime. 06/07/18   Ethelda Chick, MD  tamsulosin (FLOMAX) 0.4 MG CAPS capsule Take 0.4 mg by mouth daily.    [provider]    No Known Allergies  Patient Active Problem List   Diagnosis Date Noted  . Class 1 obesity due to excess calories without serious comorbidity with body mass index (BMI) of 33.0 to 33.9 in adult 02/09/2017  . BPH (benign prostatic hyperplasia) 02/17/2016  . Overactive bladder 02/10/2016  . Anxiety and depression 10/22/2015  . PTSD (post-traumatic stress disorder) 10/22/2015  . Insomnia 10/22/2015    Past Medical History:  Diagnosis Date  . Anxiety   . Cataract   . GERD (gastroesophageal reflux disease)   . Herpes labialis   . Overactive bladder    followed by urology/[facility] yearly    Past Surgical History:  Procedure Laterality Date  . REFRACTIVE SURGERY      Social History   Socioeconomic History  . Marital status: Divorced    Spouse name: Not on file  . Number of  children: 2  . Years of education: Not on file  . Highest education level: Not on file  Occupational History  . Occupation: retired    Associate Professor: [employer]    Comment: Medical illustrator.  Social Needs  . Financial resource strain: Not on file  . Food insecurity:    Worry: Not on file    Inability: Not on file  . Transportation needs:    Medical: Not on file    Non-medical: Not on file  Tobacco Use  . Smoking status: Never Smoker  . Smokeless tobacco: Never Used  Substance and Sexual Activity  . Alcohol use: No  . Drug use: No  . Sexual activity: Yes  Lifestyle  . Physical activity:    Days per week: Not on file    Minutes per session: Not on file  . Stress: Not on file  Relationships  . Social connections:    Talks on phone: Not on file    Gets together: Not on file    Attends religious service: Not on file    Active member of club or organization: Not on file    Attends meetings of clubs or organizations: Not on file    Relationship status: Not on file  . Intimate partner violence:    Fear of current or ex partner: Not on file    Emotionally abused: Not on file  Physically abused: Not on file    Forced sexual activity: Not on file  Other Topics Concern  . Not on file  Social History Narrative   Marital status; divorced; dating in 2018     Children: 2 children; 7 grandchildren      Lives: alone      Employment: retired in 2015; retired Research officer, political party x 21 years      Tobacco: none      Alcohol: none      Drugs; none      Exercise: gym; weights; stepper; twice weekly.      Family History  Problem Relation Age of Onset  . Hypertension Mother   . Diabetes Mother   . Alcohol abuse Father      Review of Systems  Constitutional: Negative.  Negative for chills and fever.  HENT: Negative.   Eyes: Negative.  Negative for blurred vision and double vision.  Respiratory: Negative.  Negative for cough and shortness of breath.   Cardiovascular: Negative.   Negative for chest pain and palpitations.  Gastrointestinal: Negative.  Negative for abdominal pain, nausea and vomiting.  Genitourinary: Negative.   Skin: Negative.  Negative for rash.  Neurological: Negative.  Negative for dizziness and headaches.  Psychiatric/Behavioral: Positive for depression. The patient is nervous/anxious.   All other systems reviewed and are negative.  Vitals:   12/29/18 0837  BP: 140/82  Pulse: (!) 53  Resp: 16  Temp: 98.3 F (36.8 C)  SpO2: 100%     Physical Exam Vitals signs reviewed.  Constitutional:      Appearance: Normal appearance.  HENT:     Head: Normocephalic and atraumatic.     Nose: Nose normal.     Mouth/Throat:     Mouth: Mucous membranes are moist.     Pharynx: Oropharynx is clear.  Eyes:     Extraocular Movements: Extraocular movements intact.     Conjunctiva/sclera: Conjunctivae normal.     Pupils: Pupils are equal, round, and reactive to light.  Neck:     Musculoskeletal: Normal range of motion and neck supple.  Cardiovascular:     Rate and Rhythm: Normal rate and regular rhythm.     Heart sounds: Normal heart sounds.  Pulmonary:     Effort: Pulmonary effort is normal.     Breath sounds: Normal breath sounds.  Abdominal:     Palpations: Abdomen is soft.     Tenderness: There is no abdominal tenderness.  Musculoskeletal: Normal range of motion.  Skin:    General: Skin is warm and dry.     Capillary Refill: Capillary refill takes less than 2 seconds.  Neurological:     General: No focal deficit present.     Mental Status: He is alert and oriented to person, place, and time.  Psychiatric:        Mood and Affect: Mood normal.        Behavior: Behavior normal.    A total of 25 minutes was spent in the room with the patient, greater than 50% of which was in counseling/coordination of care regarding chronic medical problems, treatment, medications and side effects, and need to follow-up with psychiatry..   ASSESSMENT &  PLAN: Nicholas Huang was seen today for anxiety.  Diagnoses and all orders for this visit:  Anxiety and depression -     Ambulatory referral to Psychiatry  PTSD (post-traumatic stress disorder) -     Ambulatory referral to Psychiatry    Patient Instructions  If you have lab work done today you will be contacted with your lab results within the next 2 weeks.  If you have not heard from us then please contact us. The fastest way to get your results is to register for My Chart.   IF you received an x-ray today, you will receive an invoice from [facility]. Please contact [facility] at [phone] with questions or concerns regarding your invoice.   IF you received labwork today, you will receive an invoice from [facility]. Please contact [facility] at [phone] with questions or concerns regarding your invoice.   Our billing staff will not be able to assist you with questions regarding bills from these companies.  You will be contacted with the lab results as soon as they are available. The fastest way to get your results is to activate your My Chart account. Instructions are located on the last page of this paperwork. If you have not heard from us regarding the results in 2 weeks, please contact this office.      Living With Post-Traumatic Stress Disorder If you have been diagnosed with post-traumatic stress disorder (PTSD), you may be relieved that you now know why you have felt or behaved a certain way. Still, you may feel overwhelmed about the treatment ahead. You may also wonder how to get the support you need and how to deal with the condition day-to-day. If you are living with PTSD, there are ways to help you recover from it and manage your symptoms. How to manage lifestyle changes Managing stress Stress is your body's reaction to life changes and events, both good and bad. Stress can make PTSD worse. Take the following steps to cope with stress:  Talk  with your health care provider or a counselor if you would like to learn more about techniques to reduce your stress. He or she may suggest some stress reduction techniques such as: ? Muscle relaxation exercises. ? Regular exercise. ? Meditation, yoga, or other mind-body exercises. ? Breathing exercises. ? Listening to quiet music. ? Spending time outside.  Maintain a healthy lifestyle. Eat a healthy diet, exercise regularly, get plenty of sleep, and take time to relax.  Spend time with others. Talk with them about how you are feeling and what kind of support you need. Try to not isolate yourself, even though you may feel like doing that. Isolating yourself can delay your recovery.  Do activities and hobbies that you enjoy.  Pace yourself when doing stressful things. Take breaks, and reward yourself when you finish. Make sure that you do not overload your schedule.  Medicines Your health care provider may suggest certain medicines if he or she feels that they will help to improve your condition. Antidepressants or antipsychotic medicines may be used to treat PTSD. Avoid using alcohol and other substances that may prevent your medicines from working properly (may interact). It is also important to:  Talk with your pharmacist or health care provider about all medicines that you take, their possible side effects, and which medicines are safe to take together.  Make it your goal to take part in all treatment decisions (shared decision-making). Ask about possible side effects of medicines that your health care provider recommends, and tell him or her how you feel about having those side effects. It is best if shared decision-making with your health care provider is part of your total treatment plan. If your health care provider prescribes a medicine, you may not notice the full benefits of  it for 4-8 weeks. Most people who are treated for PTSD need to take medicine for at least 6-12 months after  they feel better. If you are taking medicines as part of your treatment, do not stop taking medicines before you ask your health care provider if it is safe to stop. You may need to have the medicine slowly decreased (tapered) over time to lower the risk of harmful side effects. Relationships Many people who have PTSD have difficulty trusting others. Make an effort to:  Take risks and develop trust with close friends and family members. Developing trust in others can help you feel safe and connect you with emotional support.  Be open and honest about your feelings.  Try to have fun and relax in safe spaces, such as with friends and family.  Think about going to couples counseling, family education classes, or family therapy. Your loved ones may not always know how to be supportive. Therapy can be helpful for everyone. How to recognize changes in your condition Be aware of your symptoms and how often you have them. The following symptoms mean that you need to seek help for your PTSD:  You feel suspicious and angry.  You have repeated flashbacks.  You avoid going out or being with others.  You have an increasing number of fights with close friends or family members, such as your spouse.  You have thoughts about hurting yourself or others.  You cannot get relief from feelings of depression or anxiety. Where to find support Talking to others  Explain that PTSD is a mental health problem. It is something that a person can develop after experiencing or seeing a life-threatening event. Tell them that PTSD makes you feel stress like you did during the event.  Talk to your loved ones about the symptoms you have. Also tell them what things or situations can cause symptoms to start (are triggers for you).  Assure your loved ones that there are treatments to help PTSD. Discuss possibly seeking family therapy or couples therapy.  If you are worried or fearful about seeking treatment, ask for  support. Finances Not all insurance plans cover mental health care, so it is important to check with your insurance carrier. If paying for co-pays or counseling services is a problem, search for a local or county mental health care center. Public mental health care services may be offered there at a low cost or no cost when you are not able to see a private health care provider. If you are a veteran, contact a local veterans organization or veterans hospital for more information. If you are taking medicine for PTSD, you may be able to get the genericform, which may be less expensive than brand-name medicine. Some makers of prescription medicines also offer help to patients who cannot afford the medicines that they need. Community resources  Find a support group in your community. Often, groups are available for [employer] veterans, trauma victims, and family members or caregivers.  Look into volunteer opportunities. Taking part in these can help you feel more connected to your community.  Contact a local organization to find out if you are eligible for a service dog.  Keep daily contact with at least one trusted friend or family member. Follow these instructions at home: Lifestyle  Exercise regularly. Try to do 30 or more minutes of physical activity on most days of the week.  Try to get 7-9 hours of sleep each night. To help with sleep: ? Keep your  bedroom cool and dark. ? Do not eat a heavy meal during the hour before you go to bed. ? Do not drink alcohol or caffeinated drinks before bed. ? Avoid screen time before bedtime. This means avoiding use of your TV, computer, tablet, and cell phone.  Avoid using alcohol or drugs.  Practice self-soothing skills and use them daily.  Try to have fun and seek humor in your life. General instructions  If your PTSD is affecting your marriage or family, seek help from a family therapist.  Take over-the-counter and prescription medicines only as  told by your health care provider.  Make sure to let all of your health care providers know that you have PTSD. This is especially important if you are having surgery or need to be admitted to the hospital.  Keep all follow-up visits as told by your health care providers. This is important. Where to find more information Go to this website to find more information about PTSD, treatment for PTSD, and how to get support:  [facility] for PTSD: www.ptsd.FitBoxer.tn Contact a health care provider if:  Your symptoms get worse or they do not get better. Get help right away if:  You have thoughts about hurting yourself or others. If you ever feel like you may hurt yourself or others, or have thoughts about taking your own life, get help right away. You can go to your nearest emergency department or call:  Your local emergency services (911 in the [place]  A suicide crisis helpline, such as the National Suicide Prevention Lifeline at 463 501 3239. This is open 24-hours a day. Summary  If you are living with PTSD, there are ways to help you recover from it and manage your symptoms.  Find supportive environments and people who understand PTSD. Spend time in those places, and maintain contact with those people.  Work with your health care team to create a management plan that includes counseling, stress management techniques, and healthy lifestyle habits. This information is not intended to replace advice given to you by your health care provider. Make sure you discuss any questions you have with your health care provider. Document Released: 03/03/2017 Document Revised: 03/03/2017 Document Reviewed: 03/03/2017 Elsevier Interactive Patient Education  2019 Elsevier Inc.      Edwina Barth, MD [facility]

## 2019-04-13 DIAGNOSIS — R350 Frequency of micturition: Secondary | ICD-10-CM | POA: Diagnosis not present

## 2019-04-13 DIAGNOSIS — N401 Enlarged prostate with lower urinary tract symptoms: Secondary | ICD-10-CM | POA: Diagnosis not present

## 2019-09-28 DIAGNOSIS — N400 Enlarged prostate without lower urinary tract symptoms: Secondary | ICD-10-CM | POA: Diagnosis not present

## 2019-10-05 DIAGNOSIS — R351 Nocturia: Secondary | ICD-10-CM | POA: Diagnosis not present

## 2019-10-05 DIAGNOSIS — N5201 Erectile dysfunction due to arterial insufficiency: Secondary | ICD-10-CM | POA: Diagnosis not present

## 2019-10-05 DIAGNOSIS — Z125 Encounter for screening for malignant neoplasm of prostate: Secondary | ICD-10-CM | POA: Diagnosis not present

## 2019-10-31 IMAGING — CT CT RENAL STONE PROTOCOL
2 of 4 series · 15 of 46 positions shown, 17 images · non-contrast
Comparison: Ultrasound August 10, 2018

CLINICAL DATA: Left testicular pain this morning.

EXAM:
CT ABDOMEN AND PELVIS WITHOUT CONTRAST
TECHNIQUE: Multidetector CT imaging of the abdomen and pelvis was performed
following the standard protocol without IV contrast.

[Series 2: stone full standard · axial · 0.68mm/px · z∈[-1024,-480]mm · 12 of 119 slices shown, 14 images]
[im 5/119  soft-tissue]
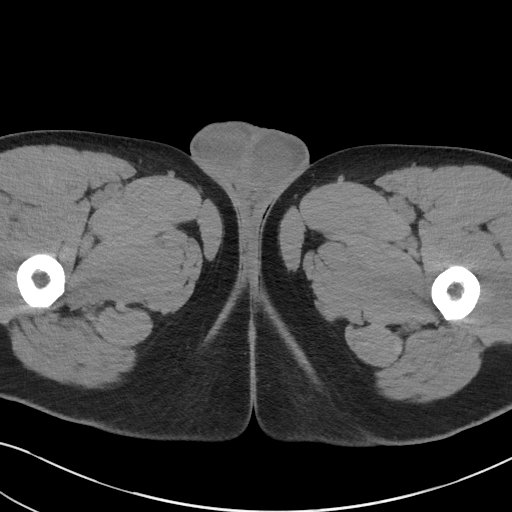
[im 5/119  bone]
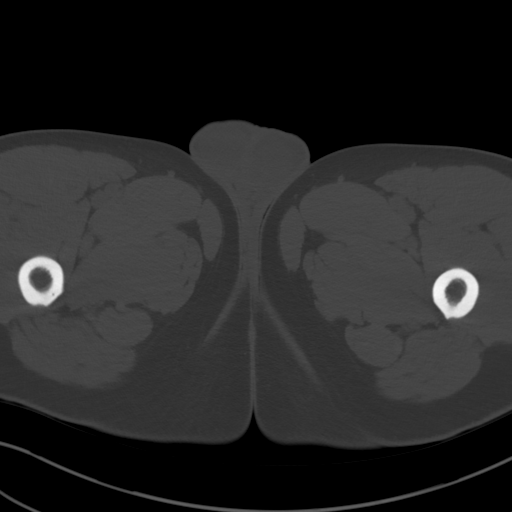
[im 15/119  soft-tissue]
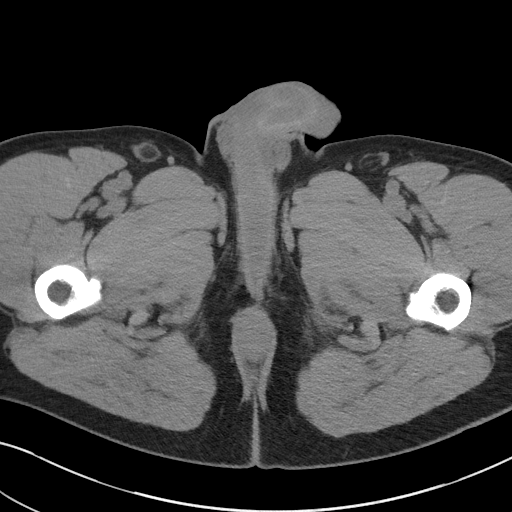
[im 25/119  soft-tissue]
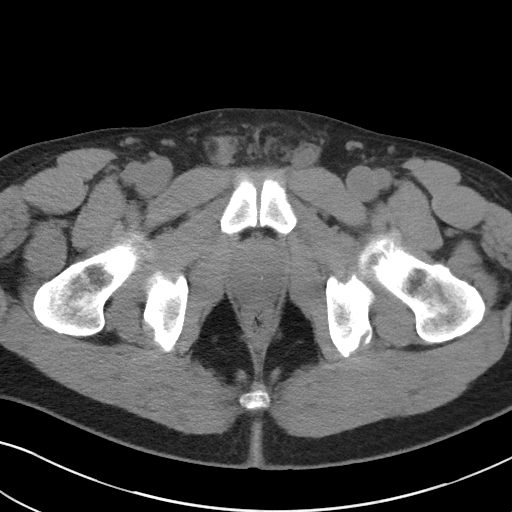
[im 35/119  soft-tissue]
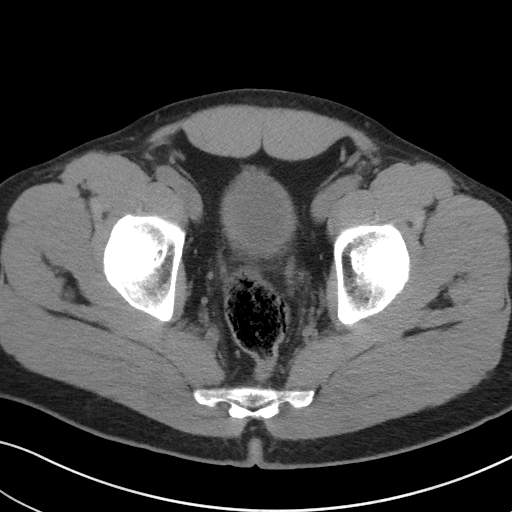
[im 45/119  soft-tissue]
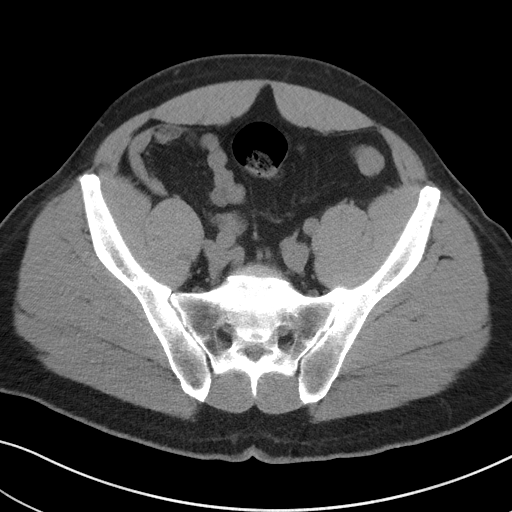
[im 55/119  soft-tissue]
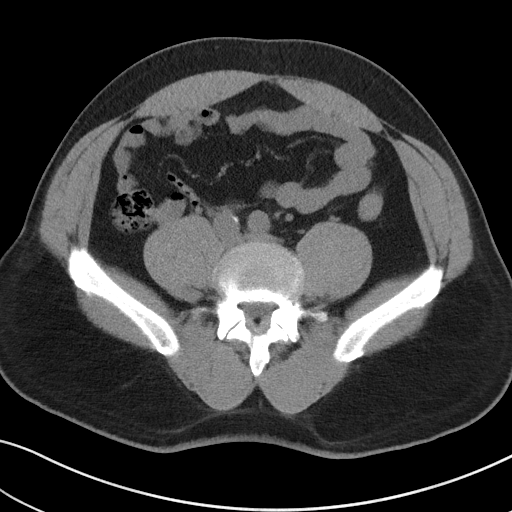
[im 64/119  soft-tissue]
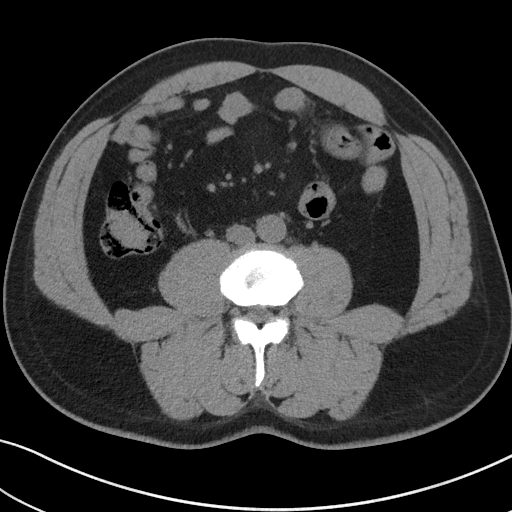
[im 74/119  soft-tissue]
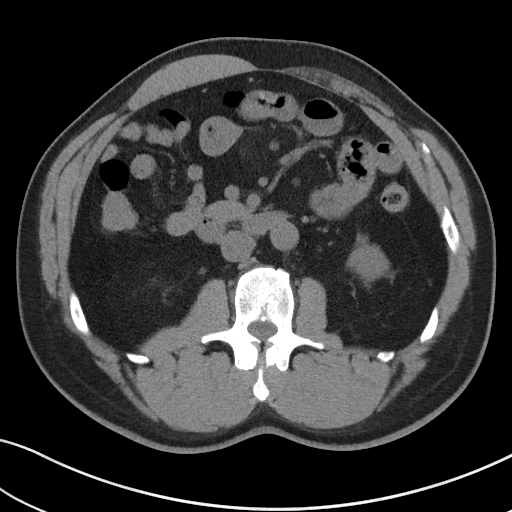
[im 84/119  soft-tissue]
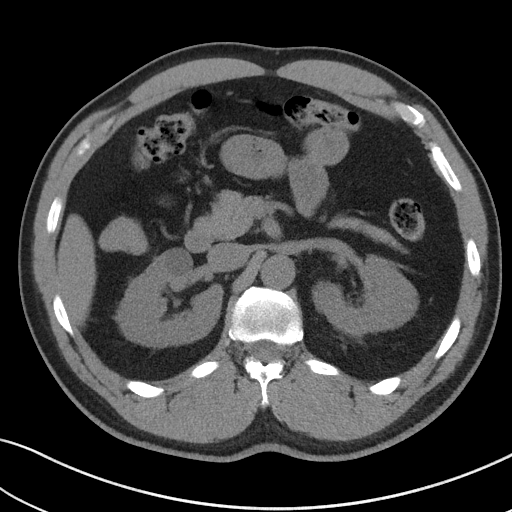
[im 84/119  bone]
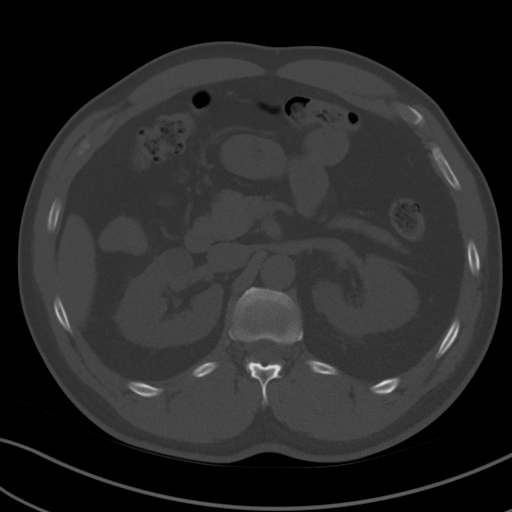
[im 94/119  soft-tissue]
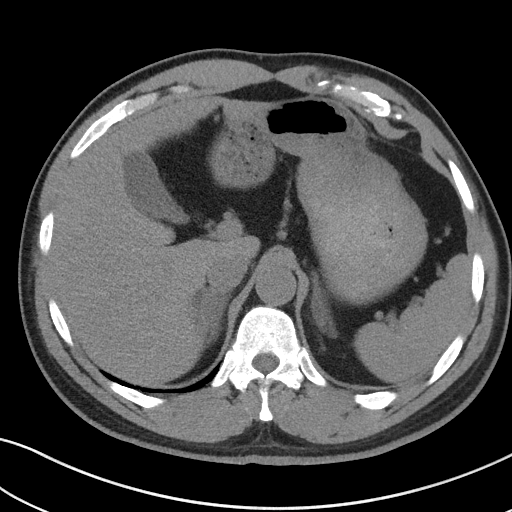
[im 104/119  soft-tissue]
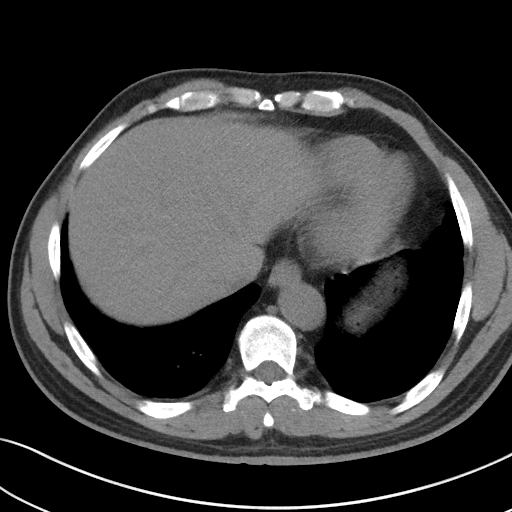
[im 114/119  soft-tissue]
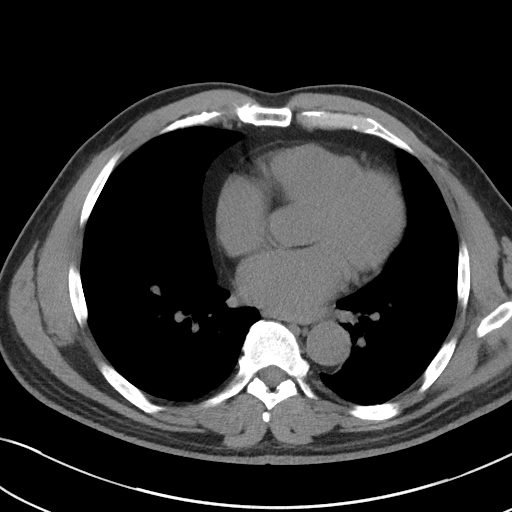

[Series 5: coronal · coronal · 0.72mm/px · 3 of 143 slices shown]
[im 48/143  soft-tissue]
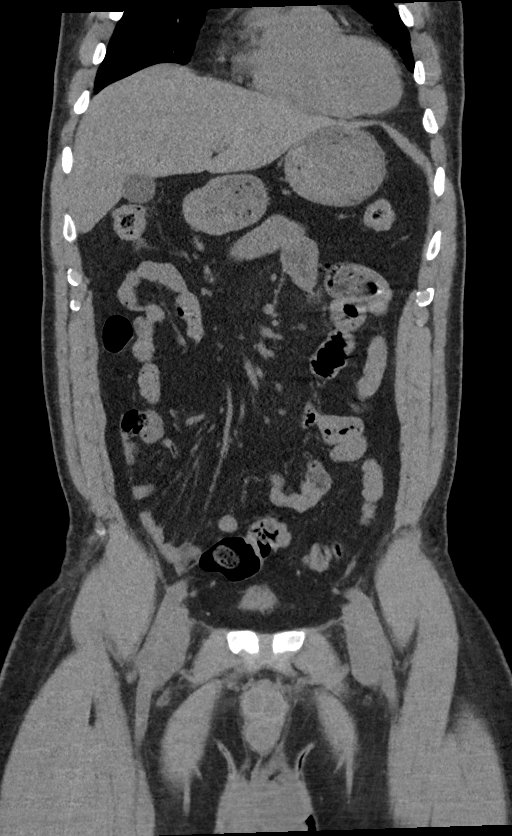
[im 64/143  soft-tissue]
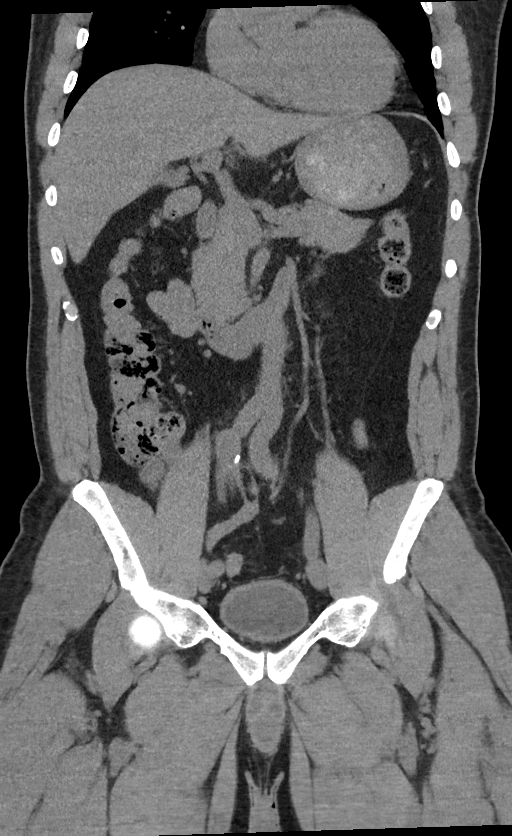
[im 79/143  soft-tissue]
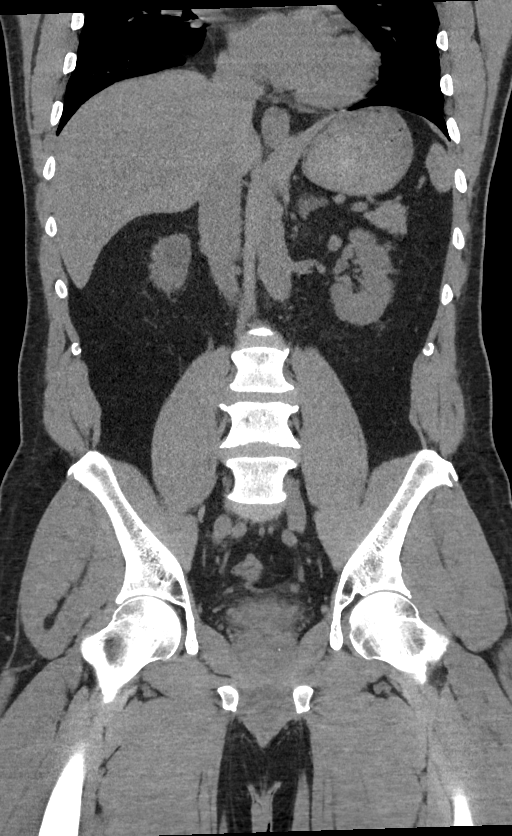

[15 of 46 positions shown; findings below may reference images not displayed]

FINDINGS: Lower chest: There is a 1.6 cm nodule in the left lower lobe image
11 series 4.

Hepatobiliary: No focal liver abnormality is seen. No gallstones,
gallbladder wall thickening, or biliary dilatation.

Pancreas: Unremarkable. No pancreatic ductal dilatation or
surrounding inflammatory changes.

Spleen: Normal in size without focal abnormality.

Adrenals/Urinary Tract: The adrenal glands are normal. There is no
hydronephrosis bilaterally. No kidney stones are identified
bilaterally. There is a 1.7 cm cyst in the anterior midpole right
kidney. The bladder is normal.

Stomach/Bowel: Stomach is within normal limits. Appendix appears
normal. No evidence of bowel wall thickening, distention, or
inflammatory changes.

Vascular/Lymphatic: Aortic atherosclerosis. No enlarged abdominal or
pelvic lymph nodes.

Reproductive: Prostate is unremarkable.

Other: There is a small right inguinal herniation of mesenteric fat.
There are bilateral hydroceles.

Musculoskeletal: Degenerative joint changes of the spine are noted.
IMPRESSION: No acute abnormality identified in the abdomen and pelvis. No
nephrolithiasis or hydronephrosis bilaterally.

Bilateral hydroceles

1.6 cm nodule in the left lower lobe. Further evaluation with a
chest CT on outpatient basis is recommended.

## 2019-11-12 DIAGNOSIS — E669 Obesity, unspecified: Secondary | ICD-10-CM | POA: Diagnosis not present

## 2019-11-12 DIAGNOSIS — Z6832 Body mass index (BMI) 32.0-32.9, adult: Secondary | ICD-10-CM | POA: Diagnosis not present

## 2019-11-12 DIAGNOSIS — F4310 Post-traumatic stress disorder, unspecified: Secondary | ICD-10-CM | POA: Diagnosis not present

## 2019-11-12 DIAGNOSIS — N529 Male erectile dysfunction, unspecified: Secondary | ICD-10-CM | POA: Diagnosis not present

## 2019-11-12 DIAGNOSIS — N400 Enlarged prostate without lower urinary tract symptoms: Secondary | ICD-10-CM | POA: Diagnosis not present

## 2019-11-27 DIAGNOSIS — N401 Enlarged prostate with lower urinary tract symptoms: Secondary | ICD-10-CM | POA: Diagnosis not present

## 2019-11-27 DIAGNOSIS — R350 Frequency of micturition: Secondary | ICD-10-CM | POA: Diagnosis not present

## 2019-11-29 DIAGNOSIS — N3000 Acute cystitis without hematuria: Secondary | ICD-10-CM | POA: Diagnosis not present

## 2019-11-29 DIAGNOSIS — R8279 Other abnormal findings on microbiological examination of urine: Secondary | ICD-10-CM | POA: Diagnosis not present

## 2019-12-11 IMAGING — US US SCROTUM W/ DOPPLER COMPLETE
1 series · 14 of 25 positions shown · non-contrast
Comparison: None.

CLINICAL DATA: Left scrotal pain and swelling for 1 day.

EXAM:
SCROTAL ULTRASOUND
DOPPLER ULTRASOUND OF THE TESTICLES
TECHNIQUE: Complete ultrasound examination of the testicles, epididymis, and
other scrotal structures was performed. Color and spectral Doppler
ultrasound were also utilized to evaluate blood flow to the
testicles.

[Series 1: us scrotum w/ doppler complete · 0.08mm/px · 14 of 70 slices shown]
[im 1/70]
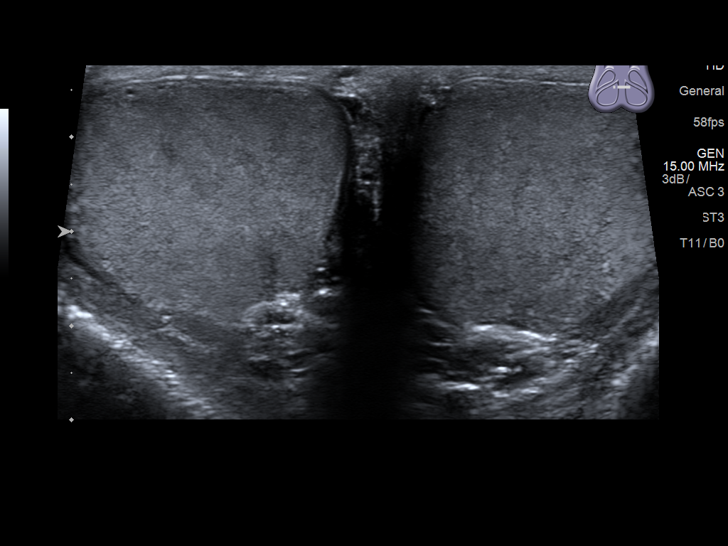
[im 6/70]
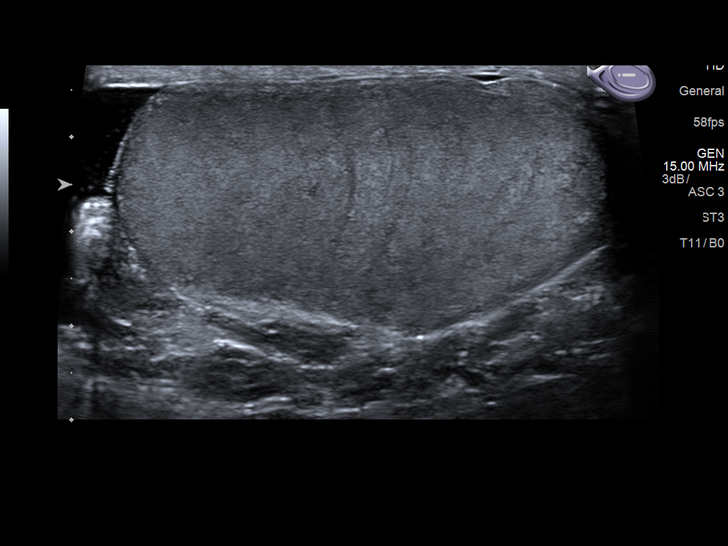
[im 12/70]
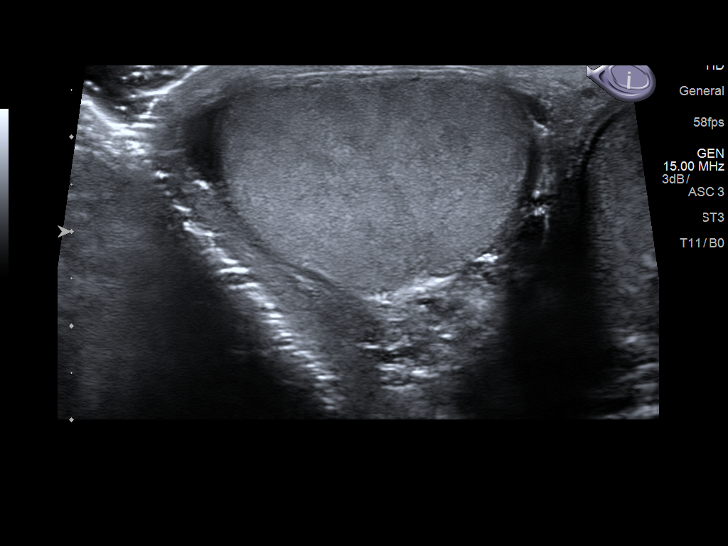
[im 18/70]
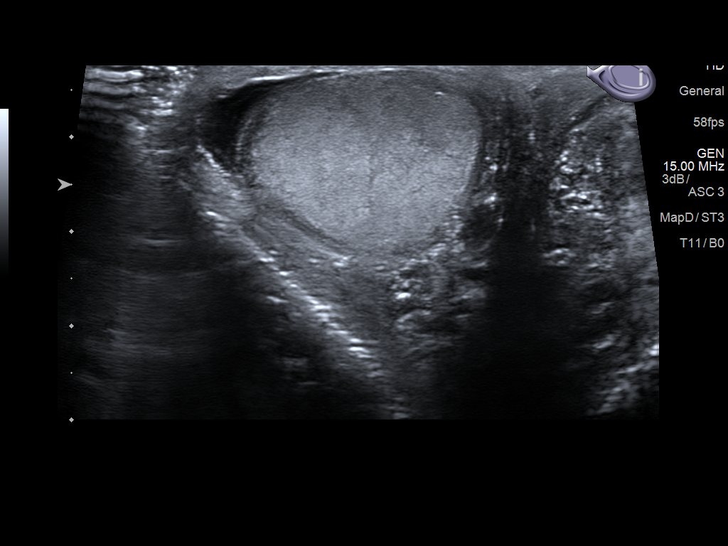
[im 24/70]
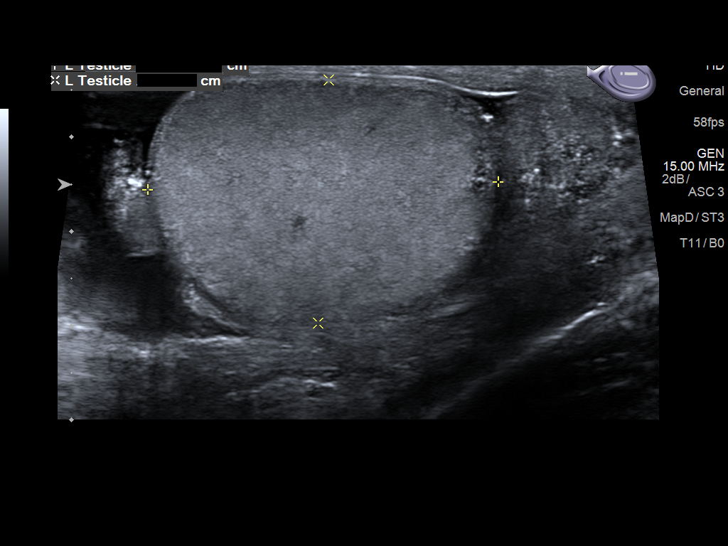
[im 26/70]
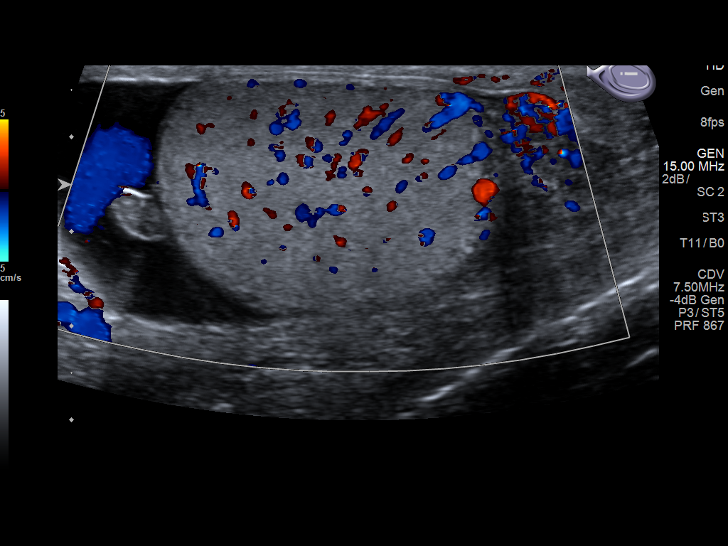
[im 32/70]
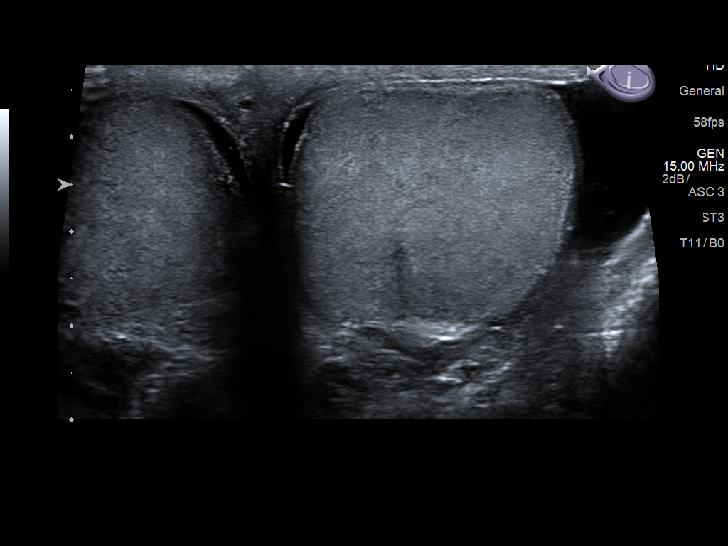
[im 38/70]
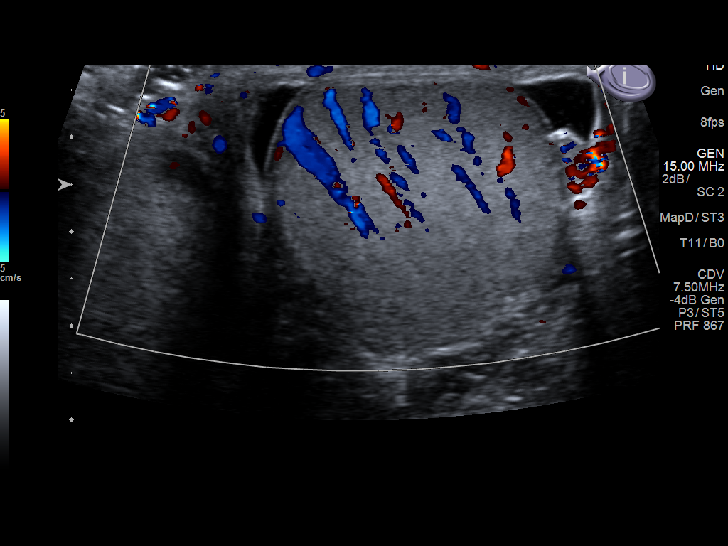
[im 44/70]
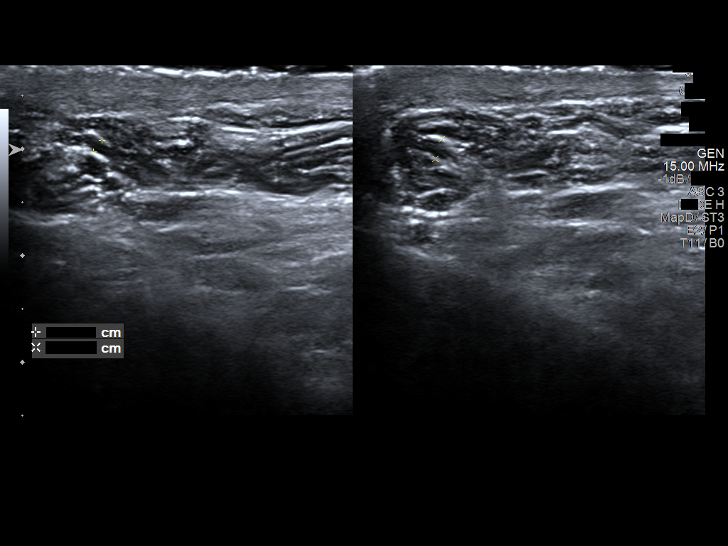
[im 47/70]
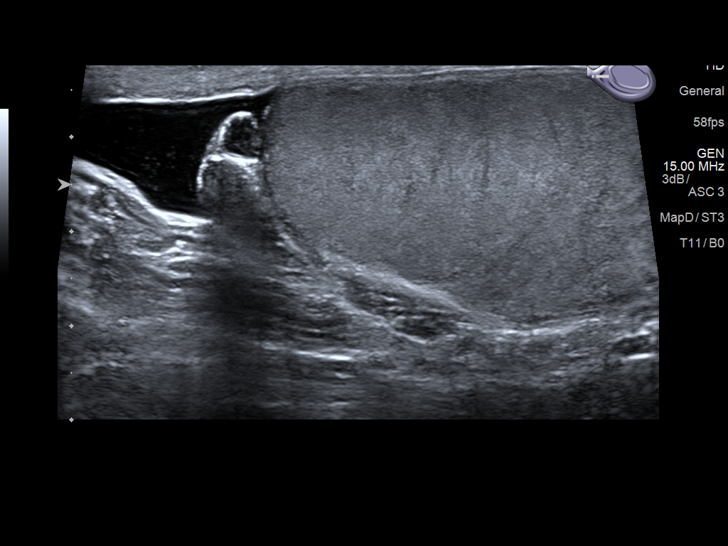
[im 52/70]
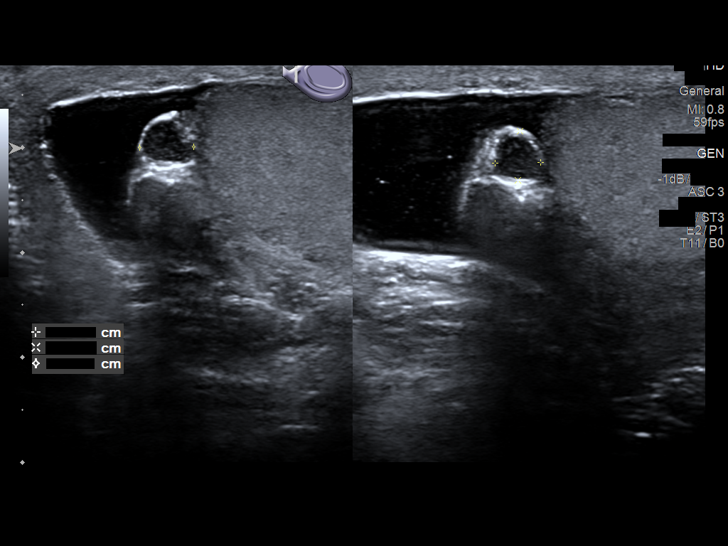
[im 58/70]
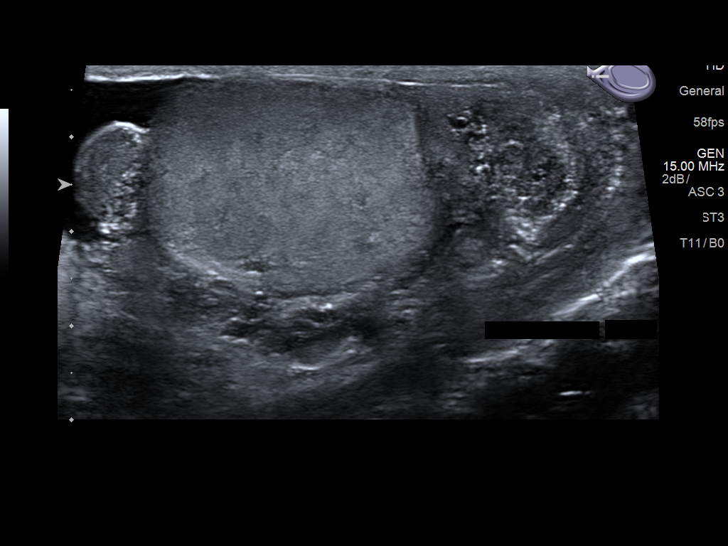
[im 64/70]
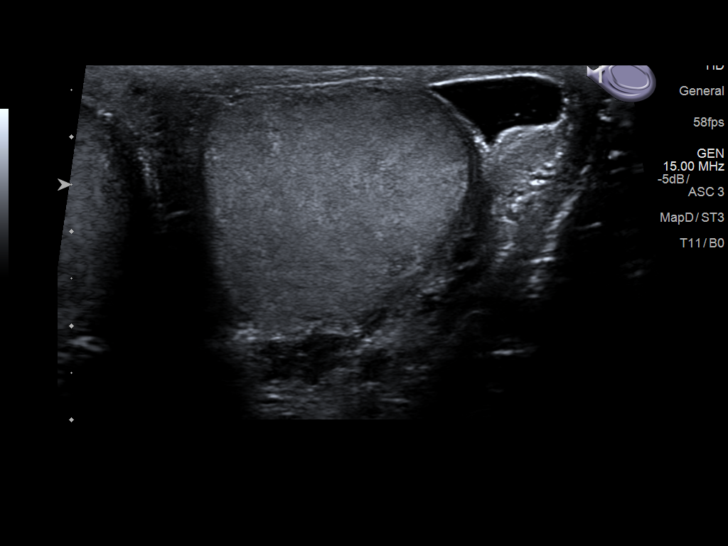
[im 70/70]
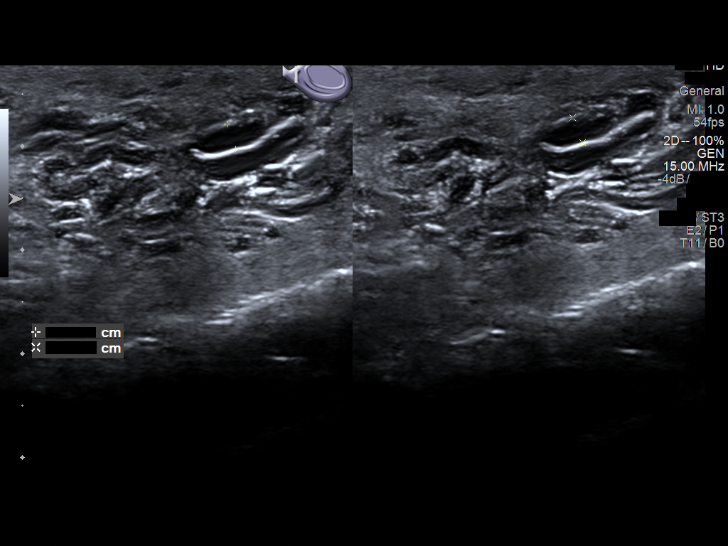

[14 of 25 positions shown; findings below may reference images not displayed]

FINDINGS: Right testicle

Measurements: 5.2 x 3.2 x 2.5 cm. No mass or microlithiasis
visualized.

Left testicle

Measurements: 3.7 x 2.9 x 2.6 cm. No mass or microlithiasis
visualized.

Right epididymis: 5 mm cyst containing low-level internal echoes in
the head of the epididymis.

Left epididymis: 4 mm cyst containing low-level internal echoes in
the head of the epididymis. The left epididymis is also mildly
enlarged and hypervascular.

Hydrocele: Moderate-sized bilateral hydroceles. These both have low
level internal echoes.

Varicocele:  None visualized.

Pulsed Doppler interrogation of both testes demonstrates normal low
resistance arterial and venous waveforms bilaterally.
IMPRESSION: 1. Findings compatible with left epididymitis.
2. Moderate-sized bilateral mildly complicated hydroceles.

## 2019-12-27 DIAGNOSIS — R351 Nocturia: Secondary | ICD-10-CM | POA: Diagnosis not present

## 2019-12-27 DIAGNOSIS — R972 Elevated prostate specific antigen [PSA]: Secondary | ICD-10-CM | POA: Diagnosis not present

## 2019-12-27 DIAGNOSIS — R350 Frequency of micturition: Secondary | ICD-10-CM | POA: Diagnosis not present

## 2019-12-27 DIAGNOSIS — N410 Acute prostatitis: Secondary | ICD-10-CM | POA: Diagnosis not present

## 2020-01-15 DIAGNOSIS — Z20822 Contact with and (suspected) exposure to covid-19: Secondary | ICD-10-CM | POA: Diagnosis not present

## 2020-01-15 DIAGNOSIS — Z20828 Contact with and (suspected) exposure to other viral communicable diseases: Secondary | ICD-10-CM | POA: Diagnosis not present

## 2020-01-17 DIAGNOSIS — R972 Elevated prostate specific antigen [PSA]: Secondary | ICD-10-CM | POA: Diagnosis not present

## 2020-01-24 DIAGNOSIS — N410 Acute prostatitis: Secondary | ICD-10-CM | POA: Diagnosis not present

## 2020-01-24 DIAGNOSIS — R972 Elevated prostate specific antigen [PSA]: Secondary | ICD-10-CM | POA: Diagnosis not present

## 2020-01-24 DIAGNOSIS — R351 Nocturia: Secondary | ICD-10-CM | POA: Diagnosis not present

## 2020-03-04 DIAGNOSIS — R972 Elevated prostate specific antigen [PSA]: Secondary | ICD-10-CM | POA: Diagnosis not present

## 2020-08-21 ENCOUNTER — Telehealth: Payer: Self-pay | Admitting: *Deleted

## 2020-08-22 DIAGNOSIS — R351 Nocturia: Secondary | ICD-10-CM | POA: Diagnosis not present

## 2020-08-22 DIAGNOSIS — R350 Frequency of micturition: Secondary | ICD-10-CM | POA: Diagnosis not present

## 2020-09-15 DIAGNOSIS — R350 Frequency of micturition: Secondary | ICD-10-CM | POA: Diagnosis not present

## 2020-09-15 DIAGNOSIS — N401 Enlarged prostate with lower urinary tract symptoms: Secondary | ICD-10-CM | POA: Diagnosis not present

## 2020-09-22 DIAGNOSIS — N400 Enlarged prostate without lower urinary tract symptoms: Secondary | ICD-10-CM | POA: Diagnosis not present

## 2020-09-29 DIAGNOSIS — N5201 Erectile dysfunction due to arterial insufficiency: Secondary | ICD-10-CM | POA: Diagnosis not present

## 2020-11-05 NOTE — Telephone Encounter (Signed)
Left message to schedule appointment
